# Patient Record
Sex: Male | Born: 1950
Health system: Southern US, Community
[De-identification: ages and names within clinical notes are randomized; demographics above are authoritative.]

## PROBLEM LIST (undated history)

## (undated) DIAGNOSIS — R0609 Other forms of dyspnea: Secondary | ICD-10-CM

## (undated) DIAGNOSIS — R030 Elevated blood-pressure reading, without diagnosis of hypertension: Secondary | ICD-10-CM

## (undated) DIAGNOSIS — J309 Allergic rhinitis, unspecified: Secondary | ICD-10-CM

## (undated) DIAGNOSIS — N400 Enlarged prostate without lower urinary tract symptoms: Secondary | ICD-10-CM

## (undated) DIAGNOSIS — H8110 Benign paroxysmal vertigo, unspecified ear: Secondary | ICD-10-CM

## (undated) DIAGNOSIS — R05 Cough: Secondary | ICD-10-CM

## (undated) DIAGNOSIS — E785 Hyperlipidemia, unspecified: Secondary | ICD-10-CM

## (undated) DIAGNOSIS — Z800 Family history of malignant neoplasm of digestive organs: Secondary | ICD-10-CM

## (undated) DIAGNOSIS — E669 Obesity, unspecified: Secondary | ICD-10-CM

## (undated) HISTORY — DX: Cough: R05

## (undated) HISTORY — DX: Hyperlipidemia, unspecified: E78.5

## (undated) HISTORY — DX: Benign paroxysmal vertigo, unspecified ear: H81.10

## (undated) HISTORY — DX: Other forms of dyspnea: R06.09

## (undated) HISTORY — DX: Obesity, unspecified: E66.9

## (undated) HISTORY — PX: TRIGGER FINGER RELEASE: SHX641

## (undated) HISTORY — DX: Allergic rhinitis, unspecified: J30.9

## (undated) HISTORY — DX: Benign prostatic hyperplasia without lower urinary tract symptoms: N40.0

## (undated) HISTORY — DX: Elevated blood-pressure reading, without diagnosis of hypertension: R03.0

## (undated) HISTORY — PX: CYSTOSCOPY: SUR368

## (undated) HISTORY — DX: Family history of malignant neoplasm of digestive organs: Z80.0

## (undated) HISTORY — PX: OTHER SURGICAL HISTORY: SHX169

---

## 2010-05-02 ENCOUNTER — Emergency Department (HOSPITAL_COMMUNITY)
Admission: EM | Admit: 2010-05-02 | Discharge: 2010-05-02 | Payer: Self-pay | Source: Home / Self Care | Admitting: Emergency Medicine

## 2010-05-06 ENCOUNTER — Emergency Department (HOSPITAL_COMMUNITY)
Admission: EM | Admit: 2010-05-06 | Discharge: 2010-05-06 | Payer: Self-pay | Source: Home / Self Care | Admitting: Emergency Medicine

## 2010-07-17 ENCOUNTER — Emergency Department (HOSPITAL_COMMUNITY)
Admission: EM | Admit: 2010-07-17 | Discharge: 2010-07-17 | Disposition: A | Discharge status: Home / Self Care | Payer: Self-pay | Attending: Emergency Medicine | Admitting: Emergency Medicine

## 2010-07-17 DIAGNOSIS — M545 Low back pain, unspecified: Secondary | ICD-10-CM | POA: Insufficient documentation

## 2010-07-17 DIAGNOSIS — R109 Unspecified abdominal pain: Secondary | ICD-10-CM | POA: Insufficient documentation

## 2010-07-17 LAB — URINALYSIS, ROUTINE W REFLEX MICROSCOPIC
Bilirubin Urine: NEGATIVE
Hgb urine dipstick: NEGATIVE
Ketones, ur: NEGATIVE mg/dL
Nitrite: NEGATIVE
Specific Gravity, Urine: 1.013 (ref 1.005–1.030)
Urobilinogen, UA: 0.2 mg/dL (ref 0.0–1.0)

## 2010-07-17 LAB — URINE MICROSCOPIC-ADD ON

## 2011-04-27 ENCOUNTER — Ambulatory Visit (INDEPENDENT_AMBULATORY_CARE_PROVIDER_SITE_OTHER): Payer: Self-pay | Admitting: Family Medicine

## 2011-04-27 ENCOUNTER — Encounter: Payer: Self-pay | Admitting: Family Medicine

## 2011-04-27 VITALS — BP 130/80 | HR 74 | Ht 66.0 in | Wt 193.2 lb

## 2011-04-27 DIAGNOSIS — N400 Enlarged prostate without lower urinary tract symptoms: Secondary | ICD-10-CM | POA: Insufficient documentation

## 2011-04-27 DIAGNOSIS — E669 Obesity, unspecified: Secondary | ICD-10-CM | POA: Insufficient documentation

## 2011-04-27 DIAGNOSIS — M25569 Pain in unspecified knee: Secondary | ICD-10-CM

## 2011-04-27 DIAGNOSIS — M25561 Pain in right knee: Secondary | ICD-10-CM

## 2011-04-27 DIAGNOSIS — M79609 Pain in unspecified limb: Secondary | ICD-10-CM

## 2011-04-27 DIAGNOSIS — M79646 Pain in unspecified finger(s): Secondary | ICD-10-CM

## 2011-04-27 DIAGNOSIS — B353 Tinea pedis: Secondary | ICD-10-CM | POA: Insufficient documentation

## 2011-04-27 DIAGNOSIS — M25562 Pain in left knee: Secondary | ICD-10-CM

## 2011-04-27 HISTORY — DX: Obesity, unspecified: E66.9

## 2011-04-27 NOTE — Assessment & Plan Note (Signed)
Very likely osteoarthritis

## 2011-04-27 NOTE — Patient Instructions (Signed)
I am assuming that your knee pain is from osteoarthritis.   You should take Acetaminophen sustained release 650 mg tablets to take one every 6 hours.   If you have a spell of worse pain, you can take 3 of the 200 mg Ibuprofen tablets three times daily with food.   When you get Daryl Levine coverage, call Dr Sheffield Slider to arrange the x-rays of your knees and for lab work.

## 2011-04-27 NOTE — Progress Notes (Signed)
  Subjective:    Patient ID: Daryl Levine, male    DOB: 1950-06-11, 60 y.o.   MRN: [id number]  HPI he is here for general the patient is primary problem is with knee pain greater on the left side. He had a procedure done by an orthopedist in 2003 but did not require him being put to sleep.  Recently he's had increasing pain described as burning. This morning the medial side of the knees. The pain occasionally awakens during the night but it's most bothersome when he first gets up or after prolonged sitting. It also worsens with walking stairs or for long distances. It does not lock, swell or give out.   He gets occasional pain in the left thumb at the site of where he had surgery for what sounds like a trigger finger.  He has had athlete's foot in the past but has not been bothersome recently  Review of Systems  Constitutional: Negative for activity change and unexpected weight change.  HENT: Negative for hearing loss.   Respiratory: Negative for cough and shortness of breath.   Cardiovascular: Negative for chest pain and leg swelling.  Gastrointestinal: Negative for abdominal pain and blood in stool.  Genitourinary: Positive for difficulty urinating. Negative for dysuria, urgency and frequency.  Musculoskeletal: Positive for arthralgias and gait problem. Negative for back pain and joint swelling.  Neurological: Negative for weakness.       Objective:   Physical Exam  Constitutional: He is oriented to person, place, and time. He appears well-developed and well-nourished.  HENT:  Head: Normocephalic.  Right Ear: External ear normal.  Left Ear: External ear normal.  Nose: Nose normal.  Mouth/Throat: Oropharynx is clear and moist.  Eyes: Conjunctivae are normal. Pupils are equal, round, and reactive to light. Right eye exhibits no discharge. Left eye exhibits no discharge. No scleral icterus.       Fundi normal  Neck: Normal range of motion. Neck supple. No thyromegaly present.    Cardiovascular: Normal rate, regular rhythm and normal heart sounds.   No murmur heard. Pulmonary/Chest: Effort normal and breath sounds normal.  Abdominal: Soft. Bowel sounds are normal. He exhibits no mass. There is no tenderness.  Genitourinary: Penis normal.       No hernias.  Musculoskeletal: Normal range of motion. He exhibits no edema and no tenderness.       Both knees tender medial joint lines and medial edges of patellae. No instability. Neg grind test.  No effusion. Squats without apparent discomfort. No apparent surgical scars on left. Nl range of motion of hips  Lymphadenopathy:    He has no cervical adenopathy.  Neurological: He is alert and oriented to person, place, and time. He has normal reflexes. No cranial nerve deficit.  Skin: Skin is warm. Rash noted.       Desquamation of soles of feet and lateral interdigital fissuring worse right foot.  Psychiatric: He has a normal mood and affect. His behavior is normal. Judgment and thought content normal.          Assessment & Plan:

## 2011-04-28 ENCOUNTER — Telehealth: Payer: Self-pay | Admitting: Family Medicine

## 2011-04-28 DIAGNOSIS — M25569 Pain in unspecified knee: Secondary | ICD-10-CM

## 2011-04-28 NOTE — Telephone Encounter (Signed)
He now has [employer] coverage so labs and xrays were ordered

## 2011-04-30 ENCOUNTER — Telehealth (INDEPENDENT_AMBULATORY_CARE_PROVIDER_SITE_OTHER): Payer: Self-pay | Admitting: Family Medicine

## 2011-04-30 DIAGNOSIS — Z1211 Encounter for screening for malignant neoplasm of colon: Secondary | ICD-10-CM

## 2011-04-30 NOTE — Telephone Encounter (Signed)
Called pt. (spanish speaking) left message to call us back. Please tell pt: order for labs ready. Pt can make appt for lab visit only. Also, if pt has a [facility] card, he can go to Radiology at [facility]. Order for x-ray done. Waiting for call back. Lorenda Hatchet, Renato Battles

## 2011-04-30 NOTE — Telephone Encounter (Signed)
The wife is calling about the orders the blood work and for an Personal assistant.

## 2011-05-03 ENCOUNTER — Ambulatory Visit (HOSPITAL_COMMUNITY)
Admission: RE | Admit: 2011-05-03 | Discharge: 2011-05-03 | Disposition: A | Discharge status: Home / Self Care | Payer: Self-pay | Source: Ambulatory Visit | Attending: Family Medicine | Admitting: Family Medicine

## 2011-05-03 DIAGNOSIS — M25569 Pain in unspecified knee: Secondary | ICD-10-CM | POA: Insufficient documentation

## 2011-05-04 ENCOUNTER — Other Ambulatory Visit: Payer: Self-pay

## 2011-05-04 DIAGNOSIS — M25569 Pain in unspecified knee: Secondary | ICD-10-CM

## 2011-05-04 LAB — CBC
MCH: 28.8 pg (ref 26.0–34.0)
MCV: 85 fL (ref 78.0–100.0)
Platelets: 314 10*3/uL (ref 150–400)
RDW: 15.2 % (ref 11.5–15.5)
WBC: 6.3 10*3/uL (ref 4.0–10.5)

## 2011-05-04 LAB — COMPREHENSIVE METABOLIC PANEL
ALT: 18 U/L (ref 0–53)
AST: 19 U/L (ref 0–37)
Creat: 0.93 mg/dL (ref 0.50–1.35)
Sodium: 137 mEq/L (ref 135–145)
Total Bilirubin: 0.6 mg/dL (ref 0.3–1.2)
Total Protein: 7 g/dL (ref 6.0–8.3)

## 2011-05-04 LAB — HEMOCCULT GUIAC POC 1CARD (OFFICE): Card #3 Fecal Occult Blood, POC: NEGATIVE

## 2011-05-04 LAB — LIPID PANEL
Cholesterol: 263 mg/dL — ABNORMAL HIGH (ref 0–200)
HDL: 59 mg/dL (ref >39)
Total CHOL/HDL Ratio: 4.5 Ratio
VLDL: 22 mg/dL (ref 0–40)

## 2011-05-04 NOTE — Telephone Encounter (Signed)
Addended by: [place], Diandre Merica on: 05/04/2011 11:56 AM   Modules accepted: Orders

## 2011-05-04 NOTE — Progress Notes (Signed)
Cmp,flp and cbc done today [facility]

## 2011-05-05 ENCOUNTER — Encounter: Payer: Self-pay | Admitting: Family Medicine

## 2011-05-06 ENCOUNTER — Telehealth: Payer: Self-pay | Admitting: Family Medicine

## 2011-05-06 NOTE — Telephone Encounter (Signed)
Daryl Levine was returning call for husband.  Please call back with msg.

## 2011-05-06 NOTE — Telephone Encounter (Signed)
Marines, would you please call this patient. See previous message. Lorenda Hatchet, Renato Battles

## 2011-05-10 ENCOUNTER — Encounter: Payer: Self-pay | Admitting: Family Medicine

## 2011-05-10 ENCOUNTER — Telehealth (HOSPITAL_COMMUNITY): Payer: Self-pay | Admitting: Family Medicine

## 2011-05-10 NOTE — Telephone Encounter (Signed)
I called Pt but nobody answer the phone I let a MSN. Pt maybe call us back.  Marines

## 2011-05-10 NOTE — Telephone Encounter (Signed)
Thank you. Please see previous messages, if pt calls back. Daryl Levine, Daryl Levine

## 2011-05-13 ENCOUNTER — Telehealth: Payer: Self-pay | Admitting: Family Medicine

## 2011-05-13 DIAGNOSIS — N400 Enlarged prostate without lower urinary tract symptoms: Secondary | ICD-10-CM

## 2011-05-13 MED ORDER — TAMSULOSIN HCL 0.4 MG PO CAPS: 0.4000 mg | ORAL_CAPSULE | Freq: Every day | ORAL | Status: DC

## 2011-05-13 NOTE — Telephone Encounter (Signed)
I reviewed Daryl Levine's  lab results with him he had received a letter which listed them. In addition I received his I PSS score which has near to severe symptoms with a score of 19.  He has started diet and exercise. We discussed options for treating the arthritis in his knees. He would like to start medication for BPH so I am sending in Tamsulosin.   He will schedule a follow up visit in 2 months.

## 2011-06-10 ENCOUNTER — Encounter: Payer: Self-pay | Admitting: Family Medicine

## 2011-06-10 ENCOUNTER — Ambulatory Visit (INDEPENDENT_AMBULATORY_CARE_PROVIDER_SITE_OTHER): Payer: Self-pay | Admitting: Family Medicine

## 2011-06-10 VITALS — BP 149/89 | HR 65 | Temp 98.3°F | Ht 66.0 in | Wt 193.4 lb

## 2011-06-10 DIAGNOSIS — M545 Low back pain, unspecified: Secondary | ICD-10-CM

## 2011-06-10 MED ORDER — LORAZEPAM 2 MG PO TABS: 2.0000 mg | ORAL_TABLET | Freq: Every evening | ORAL | Status: AC | PRN

## 2011-06-10 MED ORDER — CYCLOBENZAPRINE HCL 10 MG PO TABS: 10.0000 mg | ORAL_TABLET | Freq: Three times a day (TID) | ORAL | Status: AC | PRN

## 2011-06-10 MED ORDER — IBUPROFEN 200 MG PO TABS: 600.0000 mg | ORAL_TABLET | Freq: Four times a day (QID) | ORAL | Status: DC | PRN

## 2011-06-10 NOTE — Assessment & Plan Note (Signed)
New. 1 week duration. Appears to be muscle strain/spasm. Will treat with muscle relaxants, NSAIDs, warm compresses. Follow-up in 2 weeks if not improved. Patient and wife told may take a few weeks for pain to improve.

## 2011-06-10 NOTE — Progress Notes (Signed)
  Subjective:    Patient ID: Daryl Levine, male    DOB: June 09, 1950, 61 y.o.   MRN: [id number]  HPI Work-in for left lumbar back pain: Duration 1 week. Started happening after episode of left knee pain (he has history of arthritis). As knee pain started getting better, he noticed pain in his left back.  Pain has been getting worse. It is constant but becomes significantly worse with movement and position changes.  Alleviated by: staying still Exacerbated by: movement. No difference between sitting, standing, lying.  Medications: tried Tylenol (bid), ibuprofen (daily), or Vicodin (x1). None have really helped his pain significantly.  Associated issues: difficulty working due to the pain. Stands most of the time at work.  Review of Systems Per HPI with inclusion of following: Denies fevers Some decreased sensation down left side of leg with tingling but no numbness No weakness or recent falls No urinary or bowel incontinence Denies dysuria/frequency/urgency/hematuria    Objective:   Physical Exam Gen: appears uncomfortable, sitting stiffly up on exam table; accompanied by wife Back: muscle tension left lumbar area   ROM: flexion: 20 deg, extension: 0, lateral flexion: 20 on right, 10 on left   Strength: 5/5 lower extremities bilaterally leg and foot   Sensation: intact   Patellar reflexes: 2+ bilaterally    Assessment & Plan:

## 2011-06-10 NOTE — Patient Instructions (Signed)
I think you may have pulled a muscle in your left back. Try these medications for now: -ibuprofen 500-600 mg every 8 hours for the next 2 days. Then after 2 days, take every 8 hours as needed. -flexeril 1 tablet every 8 hours for the next 2 days then every 8 hours as needed. -Ativan 2 mg before bedtime. Try this tonight to see if it helps you sleep and helps the pain.  -Warm compresses on your back to help relax the muscle -Gentle back stretches 2 times a day.  Follow-up in 2 weeks with Dr. Sheffield Slider if your back pain is not improved or if you start losing control of your bladder or bowel or your legs start becoming weak (they drop out from under you).

## 2011-06-11 ENCOUNTER — Ambulatory Visit: Payer: Self-pay | Admitting: Family Medicine

## 2011-12-30 ENCOUNTER — Encounter: Payer: Commercial Managed Care - PPO | Admitting: Family Medicine

## 2012-04-24 ENCOUNTER — Ambulatory Visit (INDEPENDENT_AMBULATORY_CARE_PROVIDER_SITE_OTHER): Payer: Commercial Managed Care - PPO | Admitting: Family Medicine

## 2012-04-24 ENCOUNTER — Telehealth: Payer: Self-pay | Admitting: *Deleted

## 2012-04-24 VITALS — BP 144/91 | HR 75 | Temp 98.5°F | Ht 66.0 in | Wt 204.0 lb

## 2012-04-24 DIAGNOSIS — IMO0001 Reserved for inherently not codable concepts without codable children: Secondary | ICD-10-CM

## 2012-04-24 DIAGNOSIS — Z800 Family history of malignant neoplasm of digestive organs: Secondary | ICD-10-CM

## 2012-04-24 DIAGNOSIS — R053 Chronic cough: Secondary | ICD-10-CM

## 2012-04-24 DIAGNOSIS — R030 Elevated blood-pressure reading, without diagnosis of hypertension: Secondary | ICD-10-CM

## 2012-04-24 DIAGNOSIS — B353 Tinea pedis: Secondary | ICD-10-CM

## 2012-04-24 DIAGNOSIS — R05 Cough: Secondary | ICD-10-CM

## 2012-04-24 DIAGNOSIS — R059 Cough, unspecified: Secondary | ICD-10-CM

## 2012-04-24 DIAGNOSIS — E669 Obesity, unspecified: Secondary | ICD-10-CM

## 2012-04-24 HISTORY — DX: Chronic cough: R05.3

## 2012-04-24 HISTORY — DX: Reserved for inherently not codable concepts without codable children: IMO0001

## 2012-04-24 HISTORY — DX: Family history of malignant neoplasm of digestive organs: Z80.0

## 2012-04-24 NOTE — Assessment & Plan Note (Signed)
No indications of significant pathology

## 2012-04-24 NOTE — Patient Instructions (Addendum)
Please return to see Dr Sheffield Slider in 1 month for blood pressure check. Bring your blood pressure readings with you. Get a large cuff.   Return fasting in a couple weeks for cholesterol test, glucose and urine test.   Try to limit the salt in your diet.

## 2012-04-24 NOTE — Progress Notes (Signed)
  Subjective:    Patient ID: Daryl Levine, male    DOB: [birth year], 61 y.o.   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   Had a cough for a month that is from a dry tickle in his throat. Some post nasal drip, but no other allergy symptoms or wheezing. Worst during the daytime. No GERD symptoms. No shortness of breath or ankle swelling.   BPH - urination slow, stops and starts, dribbles, mild urgency, nocturia x 1. No dysuria. Never got the Tamsulosin filled    Review of Systems  Constitutional: Positive for diaphoresis. Negative for fever, chills, activity change, appetite change, fatigue and unexpected weight change.  HENT: Positive for postnasal drip. Negative for hearing loss and sneezing.   Eyes: Positive for redness. Negative for pain.  Respiratory: Positive for cough and shortness of breath. Negative for wheezing.   Cardiovascular: Negative for chest pain.  Gastrointestinal: Negative for nausea and vomiting.       Objective:   Physical Exam  Constitutional: He is oriented to person, place, and time. He appears well-developed and well-nourished.       Generalized obesity but muscular   HENT:  Head: Normocephalic.  Right Ear: External ear normal.  Left Ear: External ear normal.  Nose: Nose normal.  Mouth/Throat: Oropharynx is clear and moist. No oropharyngeal exudate.  Eyes: Conjunctivae normal and EOM are normal. Pupils are equal, round, and reactive to light. Right eye exhibits no discharge. Left eye exhibits no discharge. No scleral icterus.  Neck: Neck supple. No JVD present. No tracheal deviation present. No thyromegaly present.  Cardiovascular: Normal rate and regular rhythm.   No murmur heard. Pulmonary/Chest: Effort normal and breath sounds normal. No respiratory distress. He has no wheezes. He has no rales.  Abdominal: He  exhibits no distension and no mass. There is no tenderness.  Genitourinary: Penis normal.       No hernias  Musculoskeletal: Normal range of motion. He exhibits no edema.  Lymphadenopathy:    He has no cervical adenopathy.  Neurological: He is alert and oriented to person, place, and time. He displays normal reflexes. No cranial nerve deficit. He exhibits normal muscle tone. Coordination normal.  Skin: Skin is warm and dry.       Tinea pedis changes between lateral toes  Psychiatric: He has a normal mood and affect. His behavior is normal. Judgment and thought content normal.          Assessment & Plan:

## 2012-04-24 NOTE — Assessment & Plan Note (Signed)
Recommended he restart antifungal cream

## 2012-04-24 NOTE — Assessment & Plan Note (Signed)
He will switch to water from sugar sweetened drinks and try to decrease carbs.

## 2012-04-24 NOTE — Telephone Encounter (Signed)
Left message to call us. Pt has appt today at 4:25 pm. Please ask, if he can come in earlier. Thanks, .Arlyss Repress

## 2012-04-24 NOTE — Assessment & Plan Note (Signed)
Mildly elevated so he will try weight loss, walking, avoiding salt and recheck in one month.

## 2012-09-28 ENCOUNTER — Ambulatory Visit (INDEPENDENT_AMBULATORY_CARE_PROVIDER_SITE_OTHER): Payer: Commercial Managed Care - PPO | Admitting: Family Medicine

## 2012-09-28 ENCOUNTER — Encounter: Payer: Self-pay | Admitting: Family Medicine

## 2012-09-28 VITALS — BP 135/87 | HR 71 | Temp 98.8°F | Ht 66.0 in | Wt 204.0 lb

## 2012-09-28 DIAGNOSIS — M722 Plantar fascial fibromatosis: Secondary | ICD-10-CM

## 2012-09-28 DIAGNOSIS — E785 Hyperlipidemia, unspecified: Secondary | ICD-10-CM

## 2012-09-28 DIAGNOSIS — R509 Fever, unspecified: Secondary | ICD-10-CM | POA: Insufficient documentation

## 2012-09-28 DIAGNOSIS — R030 Elevated blood-pressure reading, without diagnosis of hypertension: Secondary | ICD-10-CM

## 2012-09-28 DIAGNOSIS — IMO0001 Reserved for inherently not codable concepts without codable children: Secondary | ICD-10-CM

## 2012-09-28 DIAGNOSIS — J029 Acute pharyngitis, unspecified: Secondary | ICD-10-CM

## 2012-09-28 LAB — POCT URINALYSIS DIPSTICK
Ketones, UA: NEGATIVE
Leukocytes, UA: NEGATIVE
Nitrite, UA: NEGATIVE
Urobilinogen, UA: 0.2
pH, UA: 5.5

## 2012-09-28 LAB — CBC WITH DIFFERENTIAL/PLATELET
Basophils Absolute: 0 10*3/uL (ref 0.0–0.1)
Basophils Relative: 1 % (ref 0–1)
HCT: 43.1 % (ref 39.0–52.0)
MCHC: 34.1 g/dL (ref 30.0–36.0)
Monocytes Absolute: 1 10*3/uL (ref 0.1–1.0)
Neutro Abs: 1.8 10*3/uL (ref 1.7–7.7)
Platelets: 233 10*3/uL (ref 150–400)
RDW: 14.4 % (ref 11.5–15.5)

## 2012-09-28 LAB — POCT RAPID STREP A (OFFICE): Rapid Strep A Screen: NEGATIVE

## 2012-09-28 LAB — POCT UA - MICROSCOPIC ONLY

## 2012-09-28 NOTE — Patient Instructions (Addendum)
Your fever is most likely due to a viral illness, although tick bourne illness is a concern this time of year. I am doing a blood count and will contact you with the result and we'll decide if antibiotic treatment is likely to be helpful.    Plantar Fasciitis (Heel Spur Syndrome) with Rehab The plantar fascia is a fibrous, ligament-like, soft-tissue structure that spans the bottom of the foot. Plantar fasciitis is a condition that causes pain in the foot due to inflammation of the tissue. SYMPTOMS   Pain and tenderness on the underneath side of the foot.  Pain that worsens with standing or walking. CAUSES  Plantar fasciitis is caused by irritation and injury to the plantar fascia on the underneath side of the foot. Common mechanisms of injury include:  Direct trauma to bottom of the foot.  Damage to a small nerve that runs under the foot where the main fascia attaches to the heel bone.  Stress placed on the plantar fascia due to bone spurs. RISK INCREASES WITH:   Activities that place stress on the plantar fascia (running, jumping, pivoting, or cutting).  Poor strength and flexibility.  Improperly fitted shoes.  Tight calf muscles.  Flat feet.  Failure to warm-up properly before activity.  Obesity. PREVENTION  Warm up and stretch properly before activity.  Allow for adequate recovery between workouts.  Maintain physical fitness:  Strength, flexibility, and endurance.  Cardiovascular fitness.  Maintain a health body weight.  Avoid stress on the plantar fascia.  Wear properly fitted shoes, including arch supports for individuals who have flat feet. PROGNOSIS  If treated properly, then the symptoms of plantar fasciitis usually resolve without surgery. However, occasionally surgery is necessary. RELATED COMPLICATIONS   Recurrent symptoms that may result in a chronic condition.  Problems of the lower back that are caused by compensating for the injury, such as  limping.  Pain or weakness of the foot during push-off following surgery.  Chronic inflammation, scarring, and partial or complete fascia tear, occurring more often from repeated injections. TREATMENT  Treatment initially involves the use of ice and medication to help reduce pain and inflammation. The use of strengthening and stretching exercises may help reduce pain with activity, especially stretches of the Achilles tendon. These exercises may be performed at home or with a therapist. Your caregiver may recommend that you use heel cups of arch supports to help reduce stress on the plantar fascia. Occasionally, corticosteroid injections are given to reduce inflammation. If symptoms persist for greater than 6 months despite non-surgical (conservative), then surgery may be recommended.  MEDICATION   If pain medication is necessary, then nonsteroidal anti-inflammatory medications, such as aspirin and ibuprofen, or other minor pain relievers, such as acetaminophen, are often recommended.  Do not take pain medication within 7 days before surgery.  Prescription pain relievers may be given if deemed necessary by your caregiver. Use only as directed and only as much as you need.  Corticosteroid injections may be given by your caregiver. These injections should be reserved for the most serious cases, because they may only be given a certain number of times. HEAT AND COLD  Cold treatment (icing) relieves pain and reduces inflammation. Cold treatment should be applied for 10 to 15 minutes every 2 to 3 hours for inflammation and pain and immediately after any activity that aggravates your symptoms. Use ice packs or massage the area with a piece of ice (ice massage).  Heat treatment may be used prior to performing the stretching  and strengthening activities prescribed by your caregiver, physical therapist, or athletic trainer. Use a heat pack or soak the injury in warm water. SEEK IMMEDIATE MEDICAL CARE  IF:  Treatment seems to offer no benefit, or the condition worsens.  Any medications produce adverse side effects. EXERCISES RANGE OF MOTION (ROM) AND STRETCHING EXERCISES - Plantar Fasciitis (Heel Spur Syndrome) These exercises may help you when beginning to rehabilitate your injury. Your symptoms may resolve with or without further involvement from your physician, physical therapist or athletic trainer. While completing these exercises, remember:   Restoring tissue flexibility helps normal motion to return to the joints. This allows healthier, less painful movement and activity.  An effective stretch should be held for at least 30 seconds.  A stretch should never be painful. You should only feel a gentle lengthening or release in the stretched tissue. RANGE OF MOTION - Toe Extension, Flexion  Sit with your right / left leg crossed over your opposite knee.  Grasp your toes and gently pull them back toward the top of your foot. You should feel a stretch on the bottom of your toes and/or foot.  Hold this stretch for __________ seconds.  Now, gently pull your toes toward the bottom of your foot. You should feel a stretch on the top of your toes and or foot.  Hold this stretch for __________ seconds. Repeat __________ times. Complete this stretch __________ times per day.  RANGE OF MOTION - Ankle Dorsiflexion, Active Assisted  Remove shoes and sit on a chair that is preferably not on a carpeted surface.  Place right / left foot under knee. Extend your opposite leg for support.  Keeping your heel down, slide your right / left foot back toward the chair until you feel a stretch at your ankle or calf. If you do not feel a stretch, slide your bottom forward to the edge of the chair, while still keeping your heel down.  Hold this stretch for __________ seconds. Repeat __________ times. Complete this stretch __________ times per day.  STRETCH  Gastroc, Standing  Place hands on  wall.  Extend right / left leg, keeping the front knee somewhat bent.  Slightly point your toes inward on your back foot.  Keeping your right / left heel on the floor and your knee straight, shift your weight toward the wall, not allowing your back to arch.  You should feel a gentle stretch in the right / left calf. Hold this position for __________ seconds. Repeat __________ times. Complete this stretch __________ times per day. STRETCH  Soleus, Standing  Place hands on wall.  Extend right / left leg, keeping the other knee somewhat bent.  Slightly point your toes inward on your back foot.  Keep your right / left heel on the floor, bend your back knee, and slightly shift your weight over the back leg so that you feel a gentle stretch deep in your back calf.  Hold this position for __________ seconds. Repeat __________ times. Complete this stretch __________ times per day. STRETCH  Gastrocsoleus, Standing  Note: This exercise can place a lot of stress on your foot and ankle. Please complete this exercise only if specifically instructed by your caregiver.   Place the ball of your right / left foot on a step, keeping your other foot firmly on the same step.  Hold on to the wall or a rail for balance.  Slowly lift your other foot, allowing your body weight to press your heel down over  the edge of the step.  You should feel a stretch in your right / left calf.  Hold this position for __________ seconds.  Repeat this exercise with a slight bend in your right / left knee. Repeat __________ times. Complete this stretch __________ times per day.  STRENGTHENING EXERCISES - Plantar Fasciitis (Heel Spur Syndrome)  These exercises may help you when beginning to rehabilitate your injury. They may resolve your symptoms with or without further involvement from your physician, physical therapist or athletic trainer. While completing these exercises, remember:   Muscles can gain both the  endurance and the strength needed for everyday activities through controlled exercises.  Complete these exercises as instructed by your physician, physical therapist or athletic trainer. Progress the resistance and repetitions only as guided. STRENGTH - Towel Curls  Sit in a chair positioned on a non-carpeted surface.  Place your foot on a towel, keeping your heel on the floor.  Pull the towel toward your heel by only curling your toes. Keep your heel on the floor.  If instructed by your physician, physical therapist or athletic trainer, add ____________________ at the end of the towel. Repeat __________ times. Complete this exercise __________ times per day. STRENGTH - Ankle Inversion  Secure one end of a rubber exercise band/tubing to a fixed object (table, pole). Loop the other end around your foot just before your toes.  Place your fists between your knees. This will focus your strengthening at your ankle.  Slowly, pull your big toe up and in, making sure the band/tubing is positioned to resist the entire motion.  Hold this position for __________ seconds.  Have your muscles resist the band/tubing as it slowly pulls your foot back to the starting position. Repeat __________ times. Complete this exercises __________ times per day.  Document Released: 05/17/2005 Document Revised: 08/09/2011 Document Reviewed: 08/29/2008 [facility] Patient Information 2013 [place], [place].

## 2012-09-28 NOTE — Assessment & Plan Note (Signed)
Since his quick strep is negative, it's unlikely that he caught strep from his wife, although he did take one of her Vantin's. There's no pyuria, so doubt UTI. Could be they both have a late season influenza. I will call him with his CBC results, to decide whether to do a chest xray or start Doxycycline. I discussed with him and his wife how taking random antibiotic left-overs can confuse the diagnostic picture. He looks minimally ill, so he will start Ibuprofen for his plantar fasciitis which will also help his fever and myalgias.

## 2012-09-28 NOTE — Assessment & Plan Note (Signed)
Classic history. Given exercise patient info.

## 2012-09-28 NOTE — Progress Notes (Signed)
  Subjective:    Patient ID: Daryl Levine, male    DOB: 12-29-1950, 62 y.o.   MRN: [id number]  HPI Fever - felt tired yesterday morning and by the evening had shaking chills and generalized myalgias, with headache when he coughs. Felt a little dyspneic on exertion yesterday. No known tick exposure. No rash. Nauseated, but no vomiting or diarrhea. His wife gave him one of her antibiotic pills prescribed yesterday.   Cough - Chronic tickle occasionally productive. Hasn't been wheezing   Family illness - Wife, [name], treated yesterday by Dr Gwendolyn Grant for sore throat x 5 days, which persists today. Quick strep was neg, but she had taken amoxicillin and cipro that she had on hand. She doesn't have fever.   right heel pain - Past month his ant-medial heel hurts, especially on first arising from bed or sleeping. No trauma or unusual exercise.   Dysuria - burning one time last week with urinary frequency x 1 month, but no urgency or nocturia.   Preventive - he'd like to get his colonoscopy done since his father died of colon CA   Review of Systems     Objective:   Physical Exam  Constitutional: He appears well-developed and well-nourished.  HENT:  Head: Normocephalic.  Right Ear: External ear normal.  Left Ear: External ear normal.  Nose: Nose normal.  Mouth/Throat: Oropharynx is clear and moist. No oropharyngeal exudate.  Eyes: Conjunctivae and EOM are normal. Pupils are equal, round, and reactive to light. Right eye exhibits no discharge. Left eye exhibits no discharge.  Neck: Normal range of motion. Neck supple.  Cardiovascular: Normal rate and regular rhythm.   No murmur heard. Pulmonary/Chest: Effort normal and breath sounds normal. He has no wheezes. He has no rales.  Abdominal: Soft. There is no tenderness. There is no rebound and no guarding.  No CVA tenderness  Musculoskeletal:  Tender over right anterior medical calcaneous.  No erythema or swelling  Lymphadenopathy:    He has no  cervical adenopathy.  Neurological: He is alert.          Assessment & Plan:

## 2012-09-29 ENCOUNTER — Telehealth: Payer: Self-pay | Admitting: Family Medicine

## 2012-09-29 ENCOUNTER — Encounter: Payer: Self-pay | Admitting: Family Medicine

## 2012-09-29 NOTE — Telephone Encounter (Signed)
I spoke with Daryl Levine to report that his CBC was normal. She said that he went to work this AM and did not have fever last evening.   She reports that she still has a severe sore throat, despite continuing the Vantin that Dr Gwendolyn Grant prescribed. She hasn't developed a cough. She isn't taking anything for pain so I recommended 600 mg of Ibuprofen with meals.   They should call if they fail to improve.

## 2013-03-12 ENCOUNTER — Ambulatory Visit (INDEPENDENT_AMBULATORY_CARE_PROVIDER_SITE_OTHER): Payer: Commercial Managed Care - PPO | Admitting: Family Medicine

## 2013-03-12 ENCOUNTER — Encounter: Payer: Self-pay | Admitting: Family Medicine

## 2013-03-12 VITALS — BP 143/85 | HR 81 | Temp 98.3°F | Ht 66.0 in | Wt 207.1 lb

## 2013-03-12 DIAGNOSIS — IMO0002 Reserved for concepts with insufficient information to code with codable children: Secondary | ICD-10-CM | POA: Insufficient documentation

## 2013-03-12 DIAGNOSIS — L03012 Cellulitis of left finger: Secondary | ICD-10-CM

## 2013-03-12 DIAGNOSIS — R609 Edema, unspecified: Secondary | ICD-10-CM

## 2013-03-12 DIAGNOSIS — R600 Localized edema: Secondary | ICD-10-CM | POA: Insufficient documentation

## 2013-03-12 MED ORDER — CEPHALEXIN 250 MG PO CAPS: 250.0000 mg | ORAL_CAPSULE | Freq: Four times a day (QID) | ORAL | Status: DC

## 2013-03-12 NOTE — Patient Instructions (Signed)
Paronychia - will start on Keflex 250 mg four times, soak foot 3-4 times daily in epsom salt, make appointment in one week for have toenail removed.   Lower extremity swelling - check lab work, suspect that the swelling is due to you being on your feet throughout the day.   Dr. Randolm Idol will cal you with you lab results.

## 2013-03-12 NOTE — Assessment & Plan Note (Signed)
Paronychia of the left great toe over the medial aspect. No abscess present. -Patient will be starting the Keflex 250 mg 4 times daily for the next week, he is to perform daily Epsom salts soaks to promote drainage -He is to return in one week for toenail normal.

## 2013-03-12 NOTE — Assessment & Plan Note (Signed)
Patient has bilateral lower extremity edema. At this time is most likely secondary to venous insufficiency however we'll complete workup for hepatic/renal cause. No evidence of heart failure based on history and physical. -Check CBC/TSH/CMP

## 2013-03-12 NOTE — Progress Notes (Signed)
  Subjective:    Patient ID: Daryl Levine, male    DOB: [birth year], 62 y.o.   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, he thinks that he may have an ingrown toenail that may be contributing to his symptoms, he does have redness to the toe, he also has increased swelling in the dorsum of his left foot extending from the left toe, patient denies a history of paronychia, has never had this toenail removed, no fevers or chills  Patient also reports a history of bilateral lower extremity edema and has been intermittent for unknown period of time, worse at the end of the day, patient operates heavy equipment throughout the day and is mostly sitting however he does stand occasionally, he denies a history of cardiac/hepatic/renal disease. No evidence of orthopnea, no PND, no chest pain, no shortness of breath. He does not wear compression stockings during the day.  Patient is a nonsmoker.   Review of Systems  Constitutional: Negative for fever, chills and fatigue.  Respiratory: Negative for cough, choking and shortness of breath.   Cardiovascular: Negative for chest pain and palpitations.  Gastrointestinal: Negative for nausea, vomiting, diarrhea and abdominal distention.       Objective:   Physical Exam Vitals: Reviewed  General: Is a Hispanic male, no acute distress, sitting on examination table HEENT: Pupils are equal round and reactive to light, extraocular movements are intact, moist mucous membranes, neck was supple without adenopathy Cardiac: Regular in rhythm, S1 and S2 present, no murmurs, no JVD, patient able to lay flat on examination table Respiratory: Clear to auscultation bilaterally, normal effort Extremities: One plus edema in bilateral tibia, patient also has one to 2+ edema over the dorsum of the left foot Skin:  Erythema and warmth of the medial aspect of the left great toe consistent with paronychia, no active drainage, the toenail has been clipped at an angle in the medial aspect however does appear that there could toenail remaining causing the paronychia       Assessment & Plan:  Please see problem specific assessment and plan.

## 2013-03-13 ENCOUNTER — Encounter: Payer: Self-pay | Admitting: Family Medicine

## 2013-03-13 LAB — CBC WITH DIFFERENTIAL/PLATELET
Eosinophils Relative: 3 % (ref 0–5)
HCT: 42.7 % (ref 39.0–52.0)
Lymphocytes Relative: 36 % (ref 12–46)
Lymphs Abs: 2.7 10*3/uL (ref 0.7–4.0)
MCV: 84.4 fL (ref 78.0–100.0)
Monocytes Absolute: 0.8 10*3/uL (ref 0.1–1.0)
RBC: 5.06 MIL/uL (ref 4.22–5.81)
RDW: 14.1 % (ref 11.5–15.5)
WBC: 7.4 10*3/uL (ref 4.0–10.5)

## 2013-03-13 LAB — COMPLETE METABOLIC PANEL WITH GFR
ALT: 22 U/L (ref 0–53)
AST: 20 U/L (ref 0–37)
Albumin: 4.1 g/dL (ref 3.5–5.2)
Alkaline Phosphatase: 73 U/L (ref 39–117)
BUN: 20 mg/dL (ref 6–23)
CO2: 26 mEq/L (ref 19–32)
Calcium: 9 mg/dL (ref 8.4–10.5)
Chloride: 105 mEq/L (ref 96–112)
Creat: 0.88 mg/dL (ref 0.50–1.35)
GFR, Est African American: >89 mL/min
GFR, Est Non African American: >89 mL/min
Glucose, Bld: 81 mg/dL (ref 70–99)
Potassium: 4.1 mEq/L (ref 3.5–5.3)
Sodium: 137 mEq/L (ref 135–145)
Total Bilirubin: 0.7 mg/dL (ref 0.3–1.2)
Total Protein: 7 g/dL (ref 6.0–8.3)

## 2013-03-19 ENCOUNTER — Ambulatory Visit (INDEPENDENT_AMBULATORY_CARE_PROVIDER_SITE_OTHER): Payer: Commercial Managed Care - PPO | Admitting: Family Medicine

## 2013-03-19 ENCOUNTER — Encounter: Payer: Self-pay | Admitting: Family Medicine

## 2013-03-19 VITALS — BP 146/83 | HR 75 | Temp 98.1°F | Ht 66.0 in | Wt 207.7 lb

## 2013-03-19 DIAGNOSIS — L03012 Cellulitis of left finger: Secondary | ICD-10-CM

## 2013-03-19 DIAGNOSIS — IMO0002 Reserved for concepts with insufficient information to code with codable children: Secondary | ICD-10-CM

## 2013-03-19 DIAGNOSIS — R05 Cough: Secondary | ICD-10-CM

## 2013-03-19 DIAGNOSIS — R059 Cough, unspecified: Secondary | ICD-10-CM

## 2013-03-19 NOTE — Patient Instructions (Signed)
Infeccin en la ua del pie encarnada (Infected Ingrown Toenail) La ua encarnada se produce cuando el borde de la ua crece dentro de la piel y las bacterias invaden el rea. Los sntomas son dolor, sensibilidad, hinchazn y drenaje de pus en el borde de la ua. Zapatos que no ajustan bien, lesiones menores y IT consultant las uas de modo incorrecto tambin contribuyen al problema. Debe cortar las uas de forma cuadrada en vez de redondear los bordes. No las corte demasiado. Evite los zapatos ajustados o con punta. En algunos casos la zona encarnada de la ua debe extirparse. Si le retiran la ua, puede demorar entre 3 y 4 meses hasta que vuelva a Designer, industrial/product. INSTRUCCIONES PARA EL CUIDADO DOMICILIARIO  Remoje el dedo infectado en agua tibia durante 20 a 30 minutos, 3 a 4 veces por da.  Las compresas o vendajes deben cambiarse diariamente.  Tome todos los medicamentos segn las indicaciones y finalcelos.  Disminuya las actividades y [place] el pie elevado siempre que pueda, para reducir la hinchazn y las [place]. Haga esto hasta que la infeccin mejore.  Use sandalias o camine descalzo siempre que pueda mientras la zona infectada est sensible.  Concurra a una visita de seguimiento con el profesional luego de 2  3 das, si la infeccin no mejora. SOLICITE ATENCIN MDICA SI: El dedo se vuelve rojo, se hincha o duele. EST SEGURO QUE:   Comprende las instrucciones para el alta mdica.  Controlar su enfermedad.  Solicitar atencin mdica de inmediato segn las indicaciones. Document Released: 03/03/2006 Document Revised: 08/09/2011 [facility] Patient Information 2014 [place], [place]. Extirpacin de [place] ua del pie (Toenail Removal) Las uas pueden extirparse debido a traumatismos, infecciones o para English as a second language teacher crecimiento anormal. Se colocar una venda no adhesiva bien ajustada para Engineer, manufacturing. Generalmente una nueva ua crecer. La nueva ua podr crecer deformada. Muchas veces,  cuando una ua se pierde, se curar gradualmente pero puede quedar sensible durante un largo tiempo. INSTRUCCIONES PARA EL CUIDADO DOMICILIARIO  Mantenga el pie elevado para aliviar el dolor y la hinchazn. Para esto deber recostarse en una cama o sof, con la pierna colocada sobre [place], o deber sentarse con la pierna [place] arriba. Caminar o dejar que la pierna cuelgue hacia abajo, aumentar la hinchazn, se curar ms lentamente y sentir un dolor punzante.  Mantenga el vendaje, limpio y seco.  Cambie el vendaje cada 24 horas.  Luego de quitar el vendaje, remoje el pie en agua tibia jabonosa por 10  20 minutos. Realice esto 3 veces por da. Esto ayuda a reducir Chief Technology Officer y la hinchazn. Luego de sumergir [employer], coloque una venda seca y limpia. Cambie el vendaje si est hmedo o sucio.  Slo tome medicamentos de Sales promotion account executive o prescriptos para Primary school teacher, las [place], o bajar la fiebre segn las indicaciones de su mdico.  Consulte al profesional que lo asiste si ocurre algn problema. Debera aplicarse la vacuna contra el tetanos si:  No sabe cuando recibi la ltima dosis.  Nunca recibi esta vacuna.  La zona de la herida est sucia. Si usted necesita aplicarse la vacuna y se niega a recibirla, corre riesgo de contraer ttanos. sta es una enfermedad grave. Si le han aplicado la vacuna contra el ttanos, el brazo podr hincharse, Higher education careers adviser, sentirse caliente al tacto o podr aumentar la temperatura en el lugar de la inyeccin. Esto es frecuente y no representa un problema. SOLICITE ATENCIN MDICA INMEDIATAMENTE SI:  Presenta dolor intenso, hinchazn, enrojecimiento, calor, drena  lquido o sangra.  Tiene fiebre.  La hinchazn se extiende desde el dedo hacia el pie. Document Released: 09/02/2008 Document Revised: 08/09/2011 [facility] Patient Information 2014 [place], [place].  Call office if you have signs of infection such as increased redness and warmth to the area or  fevers/chills.

## 2013-03-20 NOTE — Progress Notes (Addendum)
  Subjective:    Patient ID: Daryl Levine, male    DOB: January 22, 1951, 62 y.o.   MRN: [id number]  HPI 62 y/o male presents for follow up of paronychia of left 1st great toe, medial aspect, improved s/p course of Keflex and warm soaks at home, decreased redness and pain today, presents for partial toenail removal.   Patient continues to have interrmittant cough, no PND, no orthopnea, states that he occasionally has mucus that he coughs up, no fevers or chills, once he is able to cough out the mucus his symptoms resolve, no sob, symptoms mostly occur at night, no associated abdominal pain, no heartburn, has not attempted any over the counter mucus/cough/cold medications.    Review of Systems  Constitutional: Negative for fever, chills and fatigue.  Respiratory: Positive for cough.   Cardiovascular: Negative for chest pain and leg swelling.  Gastrointestinal: Negative for diarrhea, constipation and abdominal distention.  Skin: Positive for wound.       Objective:   Physical Exam Vitals: reviewed Gen: pleasant Hispanic male, NAD Cardiac: RRR, S1 and S2 present, no murmurs, no heaves/thrills, no JVD Resp: CTAB, no wheezes, normal effort ABD: soft, nontender, no scars, normal bowel sounds Ext: trace edema, 2+ DP and radial pulses  Reviewed recent CBC/CMP/TSH which were unremarkable.   Procedure Note: The risks and benefits of the procedure were discussed with the patient. Written consent was obtained. The toenail was cleansed with and alcohol swab. Approximately 8 cc of 1% lidocaine w/o epinephrine was use to perform a digital block of the left 1st toe. The area was cleaned and prepped in a sterile fashion. A rubber band was placed around the base of the digit. A nail elevator was used to elevate the medial aspect of the nail plate. A nail splitter was used to cut the medial aspect of the nail up to the base. Hemostats were used to remove the medial aspect of the nail plate. The rubber band was  removed. Hemostasis was achieved. No complications were encountered. The patient tolerated the procedure well. The affected area was dressed with gauze and tape.        Assessment & Plan:  Please see problem specific assessment and plan.

## 2013-03-20 NOTE — Assessment & Plan Note (Signed)
Paronychia is improved with antibiotics and warm soaks. Partial toenail removal performed of the medial left 1st toe.

## 2013-03-20 NOTE — Assessment & Plan Note (Signed)
The etiology of the patients cough is unclear at this time. No physical signs of significant lung or heart pathology. Recent lab work including TSH/CBC/CMP were unremarkable. -will attempt trial of otc mucolytics -patient to return to office if symptoms persist

## 2013-05-22 ENCOUNTER — Ambulatory Visit (HOSPITAL_COMMUNITY)
Admission: RE | Admit: 2013-05-22 | Discharge: 2013-05-22 | Disposition: A | Discharge status: Home / Self Care | Payer: Commercial Managed Care - PPO | Source: Ambulatory Visit | Attending: Family Medicine | Admitting: Family Medicine

## 2013-05-22 ENCOUNTER — Ambulatory Visit (INDEPENDENT_AMBULATORY_CARE_PROVIDER_SITE_OTHER): Payer: Commercial Managed Care - PPO | Admitting: Family Medicine

## 2013-05-22 ENCOUNTER — Encounter: Payer: Self-pay | Admitting: Family Medicine

## 2013-05-22 VITALS — BP 154/93 | HR 78 | Temp 98.6°F | Ht 66.0 in | Wt 213.0 lb

## 2013-05-22 DIAGNOSIS — R059 Cough, unspecified: Secondary | ICD-10-CM

## 2013-05-22 DIAGNOSIS — R05 Cough: Secondary | ICD-10-CM | POA: Insufficient documentation

## 2013-05-22 DIAGNOSIS — M47814 Spondylosis without myelopathy or radiculopathy, thoracic region: Secondary | ICD-10-CM | POA: Insufficient documentation

## 2013-05-22 DIAGNOSIS — R0789 Other chest pain: Secondary | ICD-10-CM | POA: Insufficient documentation

## 2013-05-22 DIAGNOSIS — R030 Elevated blood-pressure reading, without diagnosis of hypertension: Secondary | ICD-10-CM

## 2013-05-22 DIAGNOSIS — IMO0001 Reserved for inherently not codable concepts without codable children: Secondary | ICD-10-CM

## 2013-05-22 MED ORDER — ALBUTEROL SULFATE HFA 108 (90 BASE) MCG/ACT IN AERS: 2.0000 | INHALATION_SPRAY | Freq: Four times a day (QID) | RESPIRATORY_TRACT | Status: DC | PRN

## 2013-05-22 NOTE — Patient Instructions (Addendum)
Acute Bronchitis Bronchitis is inflammation of the airways that extend from the windpipe into the lungs (bronchi). The inflammation often causes mucus to develop. This leads to a cough, which is the most common symptom of bronchitis.  In acute bronchitis, the condition usually develops suddenly and goes away over time, usually in a couple weeks. Smoking, allergies, and asthma can make bronchitis worse. Repeated episodes of bronchitis may cause further lung problems.  CAUSES Acute bronchitis is most often caused by the same virus that causes a cold. The virus can spread from person to person (contagious).  SIGNS AND SYMPTOMS   Cough.   Fever.   Coughing up mucus.   Body aches.   Chest congestion.   Chills.   Shortness of breath.   Sore throat.  DIAGNOSIS  Acute bronchitis is usually diagnosed through a physical exam. Tests, such as chest X-rays, are sometimes done to rule out other conditions.  TREATMENT  Acute bronchitis usually goes away in a couple weeks. Often times, no medical treatment is necessary. Medicines are sometimes given for relief of fever or cough. Antibiotics are usually not needed but may be prescribed in certain situations. In some cases, an inhaler may be recommended to help reduce shortness of breath and control the cough. A cool mist vaporizer may also be used to help thin bronchial secretions and make it easier to clear the chest.  HOME CARE INSTRUCTIONS  Get plenty of rest.   Drink enough fluids to keep your urine clear or pale yellow (unless you have a medical condition that requires fluid restriction). Increasing fluids may help thin your secretions and will prevent dehydration.   Only take over-the-counter or prescription medicines as directed by your health care provider.   Avoid smoking and secondhand smoke. Exposure to cigarette smoke or irritating chemicals will make bronchitis worse. If you are a smoker, consider using nicotine gum or skin  patches to help control withdrawal symptoms. Quitting smoking will help your lungs heal faster.   Reduce the chances of another bout of acute bronchitis by washing your hands frequently, avoiding people with cold symptoms, and trying not to touch your hands to your mouth, nose, or eyes.   Follow up with your health care provider as directed.  SEEK MEDICAL CARE IF: Your symptoms do not improve after 1 week of treatment.  SEEK IMMEDIATE MEDICAL CARE IF:  You develop an increased fever or chills.   You have chest pain.   You have severe shortness of breath.  You have bloody sputum.   You develop dehydration.  You develop fainting.  You develop repeated vomiting.  You develop a severe headache. MAKE SURE YOU:   Understand these instructions.  Will watch your condition.  Will get help right away if you are not doing well or get worse. Document Released: 06/24/2004 Document Revised: 01/17/2013 Document Reviewed: 11/07/2012 [facility] Patient Information 2014 [place], [place].   Start Albuterol as needed for cough/wheezing.  Have Chest Xray completed today.   Please check your blood pressure at home/[facility]/Pharmacy for 2 weeks and please call the office with your results.

## 2013-05-23 ENCOUNTER — Telehealth: Payer: Self-pay | Admitting: Family Medicine

## 2013-05-23 NOTE — Progress Notes (Signed)
   Subjective:    Patient ID: Daryl Levine, male    DOB: [birth year], 62 y.o.   MRN: [id number]  HPI 62 year old Hispanic male presents for followup of chronic cough. Patient has had intermittent cough for the past 6-9 months. She reports that every few days he will have intermittent coughing spells with some mucus production. He reports no associated shortness of breath or wheezing. Over the past 4-5 days he has noticed increasing cough that occurs on a daily basis, this is associated with increased rhinorrhea and nasal congestion, denies current sore throat, denies fevers, denies sick contacts, he reports some intermittent wheezing without shortness of breath, over the past few days he has used Mucinex as well as DayQuil to attempt to decrease his mucus production and nasal congestion, these products have provided minimal relief of his symptoms  Patient is a current Psychiatric nurse, denies exposure to hazardous chemicals, denies a history of dust exposure, he is a nonsmoker, he has had intermittent exposure to smoke throughout his lifetime, he currently denies any acid reflux symptoms, his symptoms occur throughout the day and are not worse in the evening time, he denies dysphasia, no hoarseness of his voice, denies seasonal allergies  Due to the chronic nature of his cough patient is requesting chest x-ray  Review of Systems  Constitutional: Negative for fever, chills and fatigue.  HENT: Positive for congestion and rhinorrhea. Negative for ear pain, facial swelling, hearing loss, sore throat, tinnitus, trouble swallowing and voice change.   Respiratory: Positive for cough and wheezing. Negative for shortness of breath.   Cardiovascular: Negative for chest pain and leg swelling.       Objective:   Physical Exam Vitals: Reviewed, blood pressure improved on repeat General: Pleasant Hispanic male, no acute distress HEENT: Normocephalic, bilateral TMs are pearly-gray without erythema or bulging,  pupils are equal round and reactive to light, extraocular movements were intact, no scleral icterus, rhinorrhea present, moist mucous membranes, no pharyngeal erythema or exudate noted, neck was supple, no thyromegaly, no anterior or posterior cervical lymphadenopathy Cardiac: Regular rate and rhythm, S1 and S2 present, no murmurs, no heaves or thrills Respiratory: Scattered wheezes, good air entry bilaterally, normal effort       Assessment & Plan:  Please see problem specific assessment and plan.

## 2013-05-23 NOTE — Assessment & Plan Note (Signed)
Blood pressure is mildly elevated today. -Patient to check blood pressure at local pharmacy multiple times over the next few weeks and to call into the office. -If elevated will initiate pharmacotherapy.

## 2013-05-23 NOTE — Telephone Encounter (Signed)
Left message to take otc cough medicine, have pharmacy transfer prescription to another [facility] pharmacy.

## 2013-05-23 NOTE — Assessment & Plan Note (Signed)
Patient presents for evaluation of chronic cough with acute exacerbation secondary to viral illness. Chronic symptoms appear to not be related to GERD, allergic rhinitis, or seasonal allergies. -Will treat acute URI with Mucinex and over-the-counter cough and cold medications. Given associated wheezing will also treat with Albuterol MDI.  -Due to the chronic nature of the cough will check chest x-ray to rule out malignancy or other pulmonary etiology -If chest x-ray negative and symptoms not resolved with as needed albuterol could consider appear treatment for GERD and allergic rhinitis.

## 2013-05-23 NOTE — Telephone Encounter (Signed)
Pt called and [facility] does not have the albuterol that was prescribed until maybe later or Friday. He was wanting to know could you call in something for the cough? jw

## 2013-05-23 NOTE — Telephone Encounter (Signed)
Spoke to patient about negative chest xray results.  He reports some chest discomfort with coughing, improved when not coughing, improved today from yesterday, no associated diaphoresis/nausea/radiation of pain. Chest is tender to touch and also has generalized soreness with acute illness. Patient counseled that likely MSK pain due to acute illness/coughing however if acutely worsens he should seek urgent medical care. Patient expressed understanding.

## 2013-05-29 ENCOUNTER — Telehealth: Payer: Self-pay | Admitting: Family Medicine

## 2013-05-29 NOTE — Telephone Encounter (Signed)
Line busy, will try again tomorrow

## 2013-05-29 NOTE — Telephone Encounter (Signed)
Patient sick having symptoms he was having last week when seen by Dr. Randolm Idol. Wife would like to speak to nurse if possible.

## 2013-05-30 ENCOUNTER — Ambulatory Visit: Payer: Commercial Managed Care - PPO | Admitting: Emergency Medicine

## 2013-05-30 ENCOUNTER — Telehealth: Payer: Self-pay | Admitting: Family Medicine

## 2013-05-30 NOTE — Telephone Encounter (Signed)
Pt called because the medication that was prescribed is not working and would like something else called. jw

## 2013-06-04 NOTE — Telephone Encounter (Signed)
Pt has appt with PCP 06/06/13. Daryl Levine.Arman Loy, Renato Battleshekla

## 2013-06-05 ENCOUNTER — Ambulatory Visit: Payer: Commercial Managed Care - PPO | Admitting: Family Medicine

## 2013-06-08 ENCOUNTER — Ambulatory Visit: Payer: Commercial Managed Care - PPO | Admitting: Family Medicine

## 2013-06-29 ENCOUNTER — Ambulatory Visit (INDEPENDENT_AMBULATORY_CARE_PROVIDER_SITE_OTHER): Payer: Commercial Managed Care - PPO | Admitting: Family Medicine

## 2013-06-29 ENCOUNTER — Encounter: Payer: Self-pay | Admitting: Family Medicine

## 2013-06-29 VITALS — BP 140/84 | HR 84 | Temp 98.7°F | Ht 66.0 in | Wt 213.3 lb

## 2013-06-29 DIAGNOSIS — R05 Cough: Secondary | ICD-10-CM

## 2013-06-29 DIAGNOSIS — J309 Allergic rhinitis, unspecified: Secondary | ICD-10-CM

## 2013-06-29 DIAGNOSIS — R059 Cough, unspecified: Secondary | ICD-10-CM

## 2013-06-29 HISTORY — DX: Allergic rhinitis, unspecified: J30.9

## 2013-06-29 MED ORDER — AZITHROMYCIN 250 MG PO TABS: ORAL_TABLET | ORAL | Status: DC

## 2013-06-29 MED ORDER — FLUTICASONE PROPIONATE 50 MCG/ACT NA SUSP: 2.0000 | Freq: Every day | NASAL | Status: DC

## 2013-06-29 MED ORDER — LORATADINE 10 MG PO TABS: 10.0000 mg | ORAL_TABLET | Freq: Every day | ORAL | Status: DC

## 2013-06-29 NOTE — Patient Instructions (Signed)
Start Flonase, Claritin, and Azithromycin.  If symptoms no better in 1-2 weeks please call Dr. Randolm IdolFletke

## 2013-06-29 NOTE — Assessment & Plan Note (Signed)
Allergic rhinitis thought to be causing chronic cough -Patient started on Claritin and Flonase daily

## 2013-06-29 NOTE — Assessment & Plan Note (Signed)
Cough of uncertain etiology. Differential at this time includes allergic rhinitis versus atypical pneumonia. -Patient will receive treatment course of Claritin, Flonase, and azithromycin -If symptoms are not relieved with this treatment will pursue additional workup including pulmonary function tests to rule out obstructive/restrictive lung disease

## 2013-06-29 NOTE — Progress Notes (Signed)
   Subjective:    Patient ID: Daryl Levine, male    DOB: 08/01/1950, 63 y.o.   MRN: [id number]  HPI 63 year old Hispanic male presents for followup of cough, cough is been present for the past 6-9 months, has been worsening over the past few months, he was recently evaluated in December of 2014 and prescribed Mucinex and told to use over-the-counter nasal decongestants as URI/nasal congestion was thought to be contributing to his symptoms, he has had previous x-rays which are outlined in the objective section, patient has continued to have significant cough especially during the evening, denies a history of smoking, denies a history of occupational exposures, no fevers, denies sick contacts, he has had some increased fatigue as of late, denies orthopnea, denies PND  Please refer to previous dictations for additional history of present illness  Review of Systems  Constitutional: Positive for fatigue. Negative for fever and chills.  HENT: Positive for congestion, postnasal drip and rhinorrhea.   Respiratory: Positive for cough. Negative for shortness of breath.   Cardiovascular: Negative for chest pain and leg swelling.       Objective:   Physical Exam Vitals: Reviewed General: Pleasant Hispanic male, accompanied by his wife, no acute distress HEENT: Normocephalic, pupils are equal round and reactive to light, no scleral icterus, extraocular movements are intact, bilateral nasal turbinates were boggy, mild rhinorrhea present, moist mucous membranes, uvula midline, no pharyngeal erythema or exudate noted, no cervical lymphadenopathy Cardiac: Regular rate and rhythm, S1 and S2 present, no murmurs, no heaves or thrills Respiratory: Clear to auscultation bilaterally, normal effort Extremities: Trace bilateral lower extremity edema       Assessment & Plan:  Please see problem specific assessment and plan.

## 2013-07-02 ENCOUNTER — Telehealth: Payer: Self-pay | Admitting: Family Medicine

## 2013-07-02 NOTE — Telephone Encounter (Signed)
Pt called today and would like a note for work he missed Saturday and today. He is still not feeling well and doesn't feel like the medication is helping. He would like the letter for work to also included a few more days. Please call [phone] and the would like to pick the note up around 11:00 today. jw

## 2013-07-02 NOTE — Telephone Encounter (Signed)
To red team - please create work note giving patient time off for Saturday 06/30/13 through tomorrow 07/03/13. Please call patient for pickup, thanks.

## 2013-07-02 NOTE — Telephone Encounter (Signed)
Will fwd. To Dr.Fletke for review. Last OV 06/29/13 .Daryl Levine, Daryl Levine

## 2013-07-04 NOTE — Telephone Encounter (Signed)
Please create note as outlined below, I may not have routed this the first time around, thanks

## 2013-07-05 ENCOUNTER — Telehealth: Payer: Self-pay | Admitting: *Deleted

## 2013-07-05 NOTE — Telephone Encounter (Signed)
Called pt. Letter for work excuse mailed to pt. Lorenda Hatchet.Chrystel Barefield, Renato Battleshekla

## 2013-12-07 ENCOUNTER — Ambulatory Visit: Payer: Commercial Managed Care - PPO | Admitting: Family Medicine

## 2014-04-23 ENCOUNTER — Encounter: Payer: Self-pay | Admitting: *Deleted

## 2014-04-23 ENCOUNTER — Ambulatory Visit (INDEPENDENT_AMBULATORY_CARE_PROVIDER_SITE_OTHER): Payer: Commercial Managed Care - PPO | Admitting: Pulmonary Disease

## 2014-04-23 ENCOUNTER — Encounter (INDEPENDENT_AMBULATORY_CARE_PROVIDER_SITE_OTHER): Payer: Self-pay

## 2014-04-23 VITALS — BP 174/98 | HR 82 | Temp 98.1°F | Ht 65.0 in | Wt 212.2 lb

## 2014-04-23 DIAGNOSIS — R0609 Other forms of dyspnea: Secondary | ICD-10-CM

## 2014-04-23 DIAGNOSIS — R053 Chronic cough: Secondary | ICD-10-CM

## 2014-04-23 DIAGNOSIS — R05 Cough: Secondary | ICD-10-CM

## 2014-04-23 DIAGNOSIS — R0600 Dyspnea, unspecified: Secondary | ICD-10-CM

## 2014-04-23 HISTORY — DX: Other forms of dyspnea: R06.09

## 2014-04-23 HISTORY — DX: Dyspnea, unspecified: R06.00

## 2014-04-23 MED ORDER — TRAMADOL HCL 50 MG PO TABS: 50.0000 mg | ORAL_TABLET | Freq: Four times a day (QID) | ORAL | Status: DC | PRN

## 2014-04-23 MED ORDER — OMEPRAZOLE 40 MG PO CPDR: 40.0000 mg | DELAYED_RELEASE_CAPSULE | Freq: Two times a day (BID) | ORAL | Status: DC

## 2014-04-23 NOTE — Assessment & Plan Note (Signed)
The patient has dyspnea on exertion with moderate activities, and hopefully we can assess this with pulmonary function studies at the next visit when his cough has decreased in severity.

## 2014-04-23 NOTE — Progress Notes (Signed)
   Subjective:    Patient ID: Daryl Levine, male    DOB: 07/30/1950, 63 y.o.   MRN: [id number]  HPI The patient is a 63 year old male who I've been asked to see for evaluation of chronic cough and dyspnea on exertion. The patient tells me that he has had a cough for 4 years, but it has accelerated over the last 4 months. The cough typically does produce mucus that his white, but he is having a hard time determining how much is coming from his throat area, and how much is coming from his chest. He does develop cough paroxysms at times, but this is not common. He describes a globus sensation with voice changes, and has a lot of throat clearing during our visit. The cough is increased with prolonged conversation, and can also be worse at night when lying down. He has had some sinus issues and sinus pressure, but is unclear if postnasal drip is an issue for him. He does have occasional reflux symptoms, but does not believe this is a frequent issue. The patient also is having dyspnea with moderate exertional activities, and feels this is a new issue. He has never smoked, and has no history of pulmonary disease or childhood asthma. He has never had pulmonary function studies, but has had a recent chest x-ray that is within normal limits. He has been treated with azithromycin without change in his symptoms, and most recently was put on Spiriva which he tried once and felt it didn't help.   Review of Systems  Constitutional: Negative for fever and unexpected weight change.  HENT: Positive for congestion, postnasal drip, rhinorrhea, sinus pressure and sneezing. Negative for dental problem, ear pain, nosebleeds, sore throat and trouble swallowing.   Eyes: Negative for redness and itching.  Respiratory: Positive for cough, chest tightness, shortness of breath and wheezing.   Cardiovascular: Negative for palpitations and leg swelling.  Gastrointestinal: Negative for nausea and vomiting.  Genitourinary: Negative for  dysuria.  Musculoskeletal: Negative for joint swelling.  Skin: Negative for rash.  Neurological: Negative for headaches.  Hematological: Does not bruise/bleed easily.  Psychiatric/Behavioral: Negative for dysphoric mood. The patient is not nervous/anxious.        Objective:   Physical Exam Constitutional:  Overweight male, no acute distress  HENT:  Nares patent without discharge  Oropharynx without exudate, palate and uvula are normal  Eyes:  Perrla, eomi, no scleral icterus  Neck:  No JVD, no TMG  Cardiovascular:  Normal rate, regular rhythm, no rubs or gallops.  No murmurs        Intact distal pulses  Pulmonary :  Normal breath sounds, no stridor or respiratory distress   No rales, rhonchi, or wheezing.  +upper airway pseudowheezing.  Abdominal:  Soft, nondistended, bowel sounds present.  No tenderness noted.   Musculoskeletal:  No lower extremity edema noted.  Lymph Nodes:  No cervical lymphadenopathy noted  Skin:  No cyanosis noted  Neurologic:  Alert, appropriate, moves all 4 extremities without obvious deficit.         Assessment & Plan:

## 2014-04-23 NOTE — Patient Instructions (Signed)
Stop spiriva inhaler. Take tramadol 50mg , one every 6 hrs if needed for cough. Omeprazole 40mg  one in am and pm before meals.  Take everyday until next visit. Stop current nasal spray, and take dymista 2 sprays each nostril each am everyday until next visit. Use hard candy (no mint or cough drops) to bathe the back of the throat during the day to help with cough.  Can use all day Limit voice use as much as possible.  No singing, yelling, and talk very little if possible. followup with me in 2 weeks, and hopefully your cough will be better enough to do breathing studies.

## 2014-04-23 NOTE — Assessment & Plan Note (Signed)
The patient has a chronic cough of 4 years duration that has increased over the last few months. It is very difficult to tell from his history and exam how much of this is from his upper airway versus lower. He clearly has some nasal congestion, has a globus sensation with voice changes, and does feel mucus in his throat. I have explained that chronic cough typically is more from the upper airway than the lower, and unfortunately today he was not able to do spirometry because of his severe coughing. I would also like to treat for possible laryngopharyngeal pharyngeal reflux, and see him back in a few weeks. Hopefully his cough has improved enough that we can do PFTs for evaluation.

## 2014-04-26 ENCOUNTER — Telehealth: Payer: Self-pay | Admitting: Pulmonary Disease

## 2014-04-26 NOTE — Telephone Encounter (Signed)
Pt spouse returned call.Daryl CurieJennifer Alycia Levine, CMA

## 2014-04-26 NOTE — Telephone Encounter (Signed)
Spoke with pt's wife, she is aware that meds are ready for pickup.  Nothing further needed at this time.

## 2014-04-26 NOTE — Telephone Encounter (Signed)
lmtcb X1 for pt's emergency contact.  [facility] [address] [facility], the omeprazole and tramadol were sent in on 11/24 and are ready for pickup.

## 2014-05-10 ENCOUNTER — Ambulatory Visit (INDEPENDENT_AMBULATORY_CARE_PROVIDER_SITE_OTHER): Payer: Commercial Managed Care - PPO | Admitting: Pulmonary Disease

## 2014-05-10 ENCOUNTER — Encounter: Payer: Self-pay | Admitting: Pulmonary Disease

## 2014-05-10 ENCOUNTER — Ambulatory Visit (HOSPITAL_COMMUNITY)
Admission: RE | Admit: 2014-05-10 | Discharge: 2014-05-10 | Disposition: A | Discharge status: Home / Self Care | Payer: Commercial Managed Care - PPO | Source: Ambulatory Visit | Attending: Pulmonary Disease | Admitting: Pulmonary Disease

## 2014-05-10 VITALS — BP 132/74 | HR 97 | Temp 98.0°F | Ht 66.0 in | Wt 208.6 lb

## 2014-05-10 DIAGNOSIS — R0609 Other forms of dyspnea: Secondary | ICD-10-CM

## 2014-05-10 DIAGNOSIS — R05 Cough: Secondary | ICD-10-CM

## 2014-05-10 DIAGNOSIS — R053 Chronic cough: Secondary | ICD-10-CM

## 2014-05-10 LAB — PULMONARY FUNCTION TEST
DL/VA % pred: 112 %
DL/VA: 4.91 ml/min/mmHg/L
DLCO UNC % PRED: 103 %
DLCO UNC: 28 ml/min/mmHg
FEF 25-75 PRE: 3.21 L/s
FEF 25-75 Post: 2.99 L/sec
FEF2575-%Change-Post: -6 %
FEF2575-%PRED-POST: 119 %
FEF2575-%Pred-Pre: 128 %
FEV1-%CHANGE-POST: 2 %
FEV1-%PRED-PRE: 103 %
FEV1-%Pred-Post: 106 %
FEV1-Post: 3.23 L
FEV1-Pre: 3.15 L
FEV1FVC-%Change-Post: 1 %
FEV1FVC-%PRED-PRE: 109 %
FEV6-%Change-Post: 1 %
FEV6-%Pred-Post: 101 %
FEV6-%Pred-Pre: 99 %
FEV6-Post: 3.88 L
FEV6-Pre: 3.83 L
FEV6FVC-%Change-Post: 0 %
FEV6FVC-%PRED-PRE: 104 %
FEV6FVC-%Pred-Post: 105 %
FVC-%Change-Post: 0 %
FVC-%Pred-Post: 95 %
FVC-%Pred-Pre: 95 %
FVC-Post: 3.88 L
FVC-Pre: 3.85 L
POST FEV1/FVC RATIO: 83 %
Post FEV6/FVC ratio: 100 %
Pre FEV1/FVC ratio: 82 %
Pre FEV6/FVC Ratio: 99 %
RV % pred: 113 %
RV: 2.35 L
TLC % PRED: 107 %
TLC: 6.63 L

## 2014-05-10 MED ORDER — ALBUTEROL SULFATE (2.5 MG/3ML) 0.083% IN NEBU
2.5000 mg | INHALATION_SOLUTION | Freq: Once | RESPIRATORY_TRACT | Status: AC
  Administered 2014-05-10: 2.5 mg via RESPIRATORY_TRACT

## 2014-05-10 NOTE — Progress Notes (Signed)
   Subjective:    Patient ID: Daryl MerinoSteve Levine, male    DOB: 10/21/1950, 63 y.o.   MRN: [id number]  HPI The patient comes in today for follow-up of his chronic cough. At the last visit, this was felt to be upper airway in origin, and he was prescribed medication to treat for postnasal drip and reflux disease. I also reviewed the behavioral therapies that will help with cyclical coughing, and stressed to him the importance of no throat clearing. He comes in today where his cough is about 25% better, but he is having significant nasal congestion and sinus pressure. Currently, he is clearing his throat constantly during our visit, and I stressed to him the him avoiding this. He stayed on the reflux medication and nasal spray, but did not take the tramadol for fear of being oversedated.   Review of Systems  Constitutional: Negative for fever and unexpected weight change.  HENT: Positive for congestion, postnasal drip, sore throat and trouble swallowing. Negative for dental problem, ear pain, nosebleeds, rhinorrhea, sinus pressure and sneezing.   Eyes: Negative for redness and itching.  Respiratory: Positive for chest tightness and wheezing. Negative for cough and shortness of breath.   Cardiovascular: Negative for palpitations and leg swelling.  Gastrointestinal: Negative for nausea and vomiting.  Genitourinary: Negative for dysuria.  Musculoskeletal: Negative for joint swelling.  Skin: Negative for rash.  Neurological: Negative for headaches.  Hematological: Does not bruise/bleed easily.  Psychiatric/Behavioral: Negative for dysphoric mood. The patient is not nervous/anxious.        Objective:   Physical Exam Overweight male in no acute distress Nose without purulence or discharge noted Neck without lymphadenopathy or thyromegaly Chest totally clear to auscultation, no wheezing Cardiac exam with regular rate and rhythm Lower extremities with no significant edema, no cyanosis Alert and oriented,  moves all 4 extremities.       Assessment & Plan:

## 2014-05-10 NOTE — Assessment & Plan Note (Signed)
The patient has had a reasonable pulmonary workup for his dyspnea on exertion, with a clear chest x-ray, clear lung exam, and no evidence for obstruction or restriction on his pulmonary function studies.

## 2014-05-10 NOTE — Patient Instructions (Signed)
Stay on omeprazole twice a day for reflux Stay on nasal spray as you are doing. You must stop clearing your throat.  Use hard candy, minimize voice use Will do an xray of your sinuses, and call you with the results. Try taking the tramadol when you are at home to help suppress cough. This is important.   Will arrange followup with me once the scan of your sinuses is done.

## 2014-05-10 NOTE — Assessment & Plan Note (Signed)
The patient has been treated aggressively for the upper airway cough syndrome, and has seen 25% improvement in his cough. He is continuing on a proton pump inhibitor for possible reflux, and also on Dymista for postnasal drip. Unfortunately, he did not take the tramadol as directed. He is continuing to clear his throat almost constantly during our visit today, and I have explained his cough will never resolve until he is able to control this. I have asked him to stay on treatment for reflux and postnasal drip, and to use hard candy to help prevent his throat clearing. 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 He is continuing to have nasal congestion and clearly has a nasal voice today. I think it is worth imaging his sinuses to see if he has chronic sinusitis. If not, this may be all allergic rhinitis, and he may benefit from an allergy evaluation. He does not have airflow obstruction on his PFTs today, but I have explained this does not rule out cough variant asthma. I would find this diagnosis to be very unlikely given his history and the fact that he has improved 25% in just 2 weeks. I have told him this is an excellent response to treatment given that he has had the cough for years.

## 2014-05-17 ENCOUNTER — Inpatient Hospital Stay: Admission: RE | Admit: 2014-05-17 | Payer: Commercial Managed Care - PPO | Source: Ambulatory Visit

## 2015-08-21 ENCOUNTER — Telehealth: Payer: Self-pay | Admitting: Family Medicine

## 2015-08-21 NOTE — Telephone Encounter (Signed)
Left message for pt to call back  °

## 2015-08-21 NOTE — Telephone Encounter (Signed)
I am willing to accept father- he can use any 8 15, 8 45, 1 15, 1 45 patient in next 3 months if wants to be seen sooner- does not have to wait until next available over 50 slot

## 2015-08-21 NOTE — Telephone Encounter (Signed)
Is it ok to switch from you Dr Zachery DauerBarnes?

## 2015-08-21 NOTE — Telephone Encounter (Signed)
This pt is the father of Daryl Levine. She says she know you. She would like to know if you will accept her father as a pt?  Pt is established with Dr Zachery DauerBarnes at the present. Is it ok to schedule? [name]'s husband has made an establish appointment in May.

## 2015-08-22 NOTE — Telephone Encounter (Signed)
Sent to me in error. 

## 2015-08-29 NOTE — Telephone Encounter (Addendum)
Left message for pt to call back  °

## 2015-11-21 ENCOUNTER — Ambulatory Visit: Payer: Commercial Managed Care - PPO | Admitting: Family Medicine

## 2016-06-10 ENCOUNTER — Encounter (HOSPITAL_COMMUNITY): Payer: Self-pay | Admitting: Emergency Medicine

## 2016-06-10 ENCOUNTER — Ambulatory Visit (HOSPITAL_COMMUNITY)
Admission: EM | Admit: 2016-06-10 | Discharge: 2016-06-10 | Disposition: A | Discharge status: Home / Self Care | Payer: Commercial Managed Care - PPO | Attending: Emergency Medicine | Admitting: Emergency Medicine

## 2016-06-10 DIAGNOSIS — J40 Bronchitis, not specified as acute or chronic: Secondary | ICD-10-CM

## 2016-06-10 MED ORDER — HYDROCOD POLST-CPM POLST ER 10-8 MG/5ML PO SUER: 5.0000 mL | Freq: Two times a day (BID) | ORAL | 0 refills | Status: DC | PRN

## 2016-06-10 MED ORDER — AMOXICILLIN 500 MG PO CAPS: 500.0000 mg | ORAL_CAPSULE | Freq: Three times a day (TID) | ORAL | 0 refills | Status: DC

## 2016-06-10 MED ORDER — PREDNISONE 50 MG PO TABS: ORAL_TABLET | ORAL | 0 refills | Status: DC

## 2016-06-10 NOTE — ED Triage Notes (Signed)
Here for cold sx onset 1 month associated w/prod cough, dyspnea, CP and back pain, BAs  Denies fevers  A&O x4... NAD

## 2016-06-10 NOTE — Discharge Instructions (Signed)
You have bronchitis. °Take azithromycin and prednisone as prescribed. °Use tussionex as needed for cough. °You should see improvement in the next 3-5 days. °If you develop fevers, difficulty breathing, or are just not getting better, please come back or go to the emergency room. ° °

## 2016-06-10 NOTE — ED Provider Notes (Signed)
[facility]    CSN: [id number] Arrival date & time: 06/10/16  1121     History   Chief Complaint Chief Complaint  Patient presents with  . URI    HPI Daryl Levine is a 66 y.o. male.   HPI  He is a 66 year old man here for evaluation of cough and mucus. He reports a several week history of intermittent cough, congestion, and mucus. He has developed some body aches, primarily in the chest and back over the last week. He has been taking Tylenol and aspirin without improvement. He denies any fevers. He has had some wheezing, but denies shortness of breath. He states he had a similar illness a year ago that was treated with antibiotics and cough medicine.  Past Medical History:  Diagnosis Date  . BPH (benign prostatic hyperplasia)   . Hyperlipidemia     Patient Active Problem List   Diagnosis Date Noted  . DOE (dyspnea on exertion) 04/23/2014  . Allergic rhinitis 06/29/2013  . Paronychia 03/12/2013  . Lower extremity edema 03/12/2013  . Fever, unspecified 09/28/2012  . Other and unspecified hyperlipidemia 09/28/2012  . Plantar fasciitis 09/28/2012  . Elevated blood pressure 04/24/2012  . Chronic cough 04/24/2012  . Family history of colon cancer 04/24/2012  . Knee pain, bilateral 04/27/2011  . Tinea pedis 04/27/2011  . Obesity 04/27/2011  . BPH (benign prostatic hyperplasia) 04/27/2011    Past Surgical History:  Procedure Laterality Date  . Arthroscopic surgery     R knee  . CYSTOSCOPY     prostate procedure?  [facility] FINGER RELEASE     L thumb       Home Medications    Prior to Admission medications   Medication Sig Start Date End Date Taking? Authorizing Provider  omeprazole (PRILOSEC) 40 MG capsule Take 1 capsule (40 mg total) by mouth 2 (two) times daily before a meal. 04/23/14  Yes Barbaraann Share, MD  acetaminophen (TYLENOL) 650 MG CR tablet Take 650 mg by mouth every 8 (eight) hours as needed.      Historical Provider, MD  amoxicillin  (AMOXIL) 500 MG capsule Take 1 capsule (500 mg total) by mouth 3 (three) times daily. 06/10/16   Charm Rings, MD  chlorpheniramine-HYDROcodone (TUSSIONEX PENNKINETIC ER) 10-8 MG/5ML SUER Take 5 mLs by mouth every 12 (twelve) hours as needed for cough. 06/10/16   Charm Rings, MD  predniSONE (DELTASONE) 50 MG tablet Take 1 pill daily for 5 days. 06/10/16   Charm Rings, MD    Family History Family History  Problem Relation Age of Onset  . Cancer Father 45    colon  . Hypertension Brother     died age 61    Social History Social History  Substance Use Topics  . Smoking status: Never Smoker  . Smokeless tobacco: Never Used  . Alcohol use No     Allergies   Patient has no known allergies.   Review of Systems Review of Systems As in history of present illness  Physical Exam Triage Vital Signs ED Triage Vitals [06/10/16 1228]  Enc Vitals Group     BP 159/93     Pulse Rate 83     Resp 18     Temp 99.5 F (37.5 C)     Temp Source Oral     SpO2 100 %     Weight      Height      Head Circumference  Peak Flow      Pain Score 8     Pain Loc      Pain Edu?      Excl. in GC?    No data found.   Updated Vital Signs BP 159/93 (BP Location: Left Arm)   Pulse 83   Temp 99.5 F (37.5 C) (Oral)   Resp 18   SpO2 100%   Visual Acuity Right Eye Distance:   Left Eye Distance:   Bilateral Distance:    Right Eye Near:   Left Eye Near:    Bilateral Near:     Physical Exam  Constitutional: He is oriented to person, place, and time. He appears well-developed and well-nourished.  HENT:  Mouth/Throat: No oropharyngeal exudate.  TMs normal bilaterally. Nasal discharge present. Oropharynx clear.  Neck: Neck supple.  Cardiovascular: Normal rate, regular rhythm and normal heart sounds.   No murmur heard. Pulmonary/Chest: Effort normal and breath sounds normal. No respiratory distress. He has no wheezes. He has no rales.  Lymphadenopathy:    He has no cervical  adenopathy.  Neurological: He is alert and oriented to person, place, and time.     UC Treatments / Results  Labs (all labs ordered are listed, but only abnormal results are displayed) Labs Reviewed - No data to display  EKG  EKG Interpretation None       Radiology No results found.  Procedures Procedures (including critical care time)  Medications Ordered in UC Medications - No data to display   Initial Impression / Assessment and Plan / UC Course  I have reviewed the triage vital signs and the nursing notes.  Pertinent labs & imaging results that were available during my care of the patient were reviewed by me and considered in my medical decision making (see chart for details).  Clinical Course     Treat for bronchitis with amoxicillin and prednisone. Tussionex for cough. Follow-up as needed.  Final Clinical Impressions(s) / UC Diagnoses   Final diagnoses:  Bronchitis    New Prescriptions New Prescriptions   AMOXICILLIN (AMOXIL) 500 MG CAPSULE    Take 1 capsule (500 mg total) by mouth 3 (three) times daily.   CHLORPHENIRAMINE-HYDROCODONE (TUSSIONEX PENNKINETIC ER) 10-8 MG/5ML SUER    Take 5 mLs by mouth every 12 (twelve) hours as needed for cough.   PREDNISONE (DELTASONE) 50 MG TABLET    Take 1 pill daily for 5 days.     Charm RingsErin J Shelene Krage, MD 06/10/16 1257

## 2016-06-11 ENCOUNTER — Telehealth (HOSPITAL_COMMUNITY): Payer: Self-pay | Admitting: Emergency Medicine

## 2016-06-11 NOTE — Telephone Encounter (Signed)
Wife called... States husband was seen here yest and treated for bronchitis  Given Amox, Prednisone and Tussionex... Wife reports pt is not feeling any better and wold like for us to call in Cheratussin. States pt was not able to sleep last night due to cough and CP due to cough.   Per Dr. Artis FlockKindl... Pt can take OTC Delsym along w/Tussionex and to wait 3-5 days. If no improvement, pt can return or to f/u w/PCP  Wife verb understanding.

## 2016-06-14 ENCOUNTER — Encounter (HOSPITAL_COMMUNITY): Payer: Self-pay | Admitting: Emergency Medicine

## 2016-06-14 ENCOUNTER — Emergency Department (HOSPITAL_COMMUNITY): Payer: Commercial Managed Care - PPO

## 2016-06-14 ENCOUNTER — Emergency Department (HOSPITAL_COMMUNITY)
Admission: EM | Admit: 2016-06-14 | Discharge: 2016-06-14 | Disposition: A | Discharge status: Home / Self Care | Payer: Commercial Managed Care - PPO | Attending: Emergency Medicine | Admitting: Emergency Medicine

## 2016-06-14 DIAGNOSIS — R059 Cough, unspecified: Secondary | ICD-10-CM

## 2016-06-14 DIAGNOSIS — R05 Cough: Secondary | ICD-10-CM | POA: Insufficient documentation

## 2016-06-14 DIAGNOSIS — Z79899 Other long term (current) drug therapy: Secondary | ICD-10-CM | POA: Insufficient documentation

## 2016-06-14 MED ORDER — ALBUTEROL SULFATE (2.5 MG/3ML) 0.083% IN NEBU
5.0000 mg | INHALATION_SOLUTION | Freq: Once | RESPIRATORY_TRACT | Status: AC
  Administered 2016-06-14: 5 mg via RESPIRATORY_TRACT
  Filled 2016-06-14: qty 6

## 2016-06-14 MED ORDER — BENZONATATE 100 MG PO CAPS: 200.0000 mg | ORAL_CAPSULE | Freq: Two times a day (BID) | ORAL | 0 refills | Status: DC | PRN

## 2016-06-14 MED ORDER — FLUTICASONE PROPIONATE 50 MCG/ACT NA SUSP: 2.0000 | Freq: Every day | NASAL | 1 refills | Status: DC

## 2016-06-14 MED ORDER — LORATADINE 10 MG PO TABS: 10.0000 mg | ORAL_TABLET | Freq: Every day | ORAL | 0 refills | Status: DC

## 2016-06-14 NOTE — ED Notes (Signed)
Patient was alert, oriented and stable upon discharge. RN went over AVS and patient had no further questions.  

## 2016-06-14 NOTE — ED Triage Notes (Signed)
Patient was dx with bronchitis on Thursday at urgent care. Patient states he continues to have a productive cough and nasal drainge despite antibiotic use. His last fever was on Friday. Patient conscious, alert, oriented, and breathing at a normal rate.

## 2016-06-14 NOTE — ED Provider Notes (Signed)
[facility]-EMERGENCY DEPT Provider Note   CSN: [id number] Arrival date & time: 06/14/16  1418   By signing my name below, I, Teofilo Pod, attest that this documentation has been prepared under the direction and in the presence of Melburn Hake, [place]. Electronically Signed: Teofilo Pod, ED Scribe. 06/14/2016. 4:00 PM.   History   Chief Complaint Chief Complaint  Patient presents with  . Cough  . Nasal Congestion    The history is provided by the patient. No language interpreter was used.   HPI Comments:  Daryl Levine is a 66 y.o. male who presents to the Emergency Department complaining of waxing and waning productive cough x 2-3 months. Pt reports that his cough has had a thick, non-bloody sputum.Pt complains of associated chest discomfort with coughing only, nasal congestion, sore throat, wheezing. Patient notes his symptoms have been unchanged for the past 2-3 months. Pt states that the cough is made worse when laying down, for the past 3-4 months. Pt is not a smoker, and denies any sick contact. Pt went to urgent care on 06/10/16 and was diagnosed and treated for bronchitis with amoxicillin, tussionex and prednisone but the treatment provided no relief. Pt denies fever, hemoptysis, vomiting, diarrhea, abdominal pain, leg swelling. Patient reports he was evaluated by his primary care provider and a pulmonologist back in August regarding his symptoms when he was thought to possibly have asthma. Patient reports using his albuterol inhaler intermittently at home with mild intermittent relief.  Past Medical History:  Diagnosis Date  . BPH (benign prostatic hyperplasia)   . Hyperlipidemia     Patient Active Problem List   Diagnosis Date Noted  . DOE (dyspnea on exertion) 04/23/2014  . Allergic rhinitis 06/29/2013  . Paronychia 03/12/2013  . Lower extremity edema 03/12/2013  . Fever, unspecified 09/28/2012  . Other and unspecified hyperlipidemia 09/28/2012  . Plantar fasciitis  09/28/2012  . Elevated blood pressure 04/24/2012  . Chronic cough 04/24/2012  . Family history of colon cancer 04/24/2012  . Knee pain, bilateral 04/27/2011  . Tinea pedis 04/27/2011  . Obesity 04/27/2011  . BPH (benign prostatic hyperplasia) 04/27/2011    Past Surgical History:  Procedure Laterality Date  . Arthroscopic surgery     R knee  . CYSTOSCOPY     prostate procedure?  [facility]on FINGER RELEASE     L thumb       Home Medications    Prior to Admission medications   Medication Sig Start Date End Date Taking? Authorizing Provider  acetaminophen (TYLENOL) 650 MG CR tablet Take 650 mg by mouth every 8 (eight) hours as needed.      Historical Provider, MD  amoxicillin (AMOXIL) 500 MG capsule Take 1 capsule (500 mg total) by mouth 3 (three) times daily. 06/10/16   Charm Rings, MD  benzonatate (TESSALON) 100 MG capsule Take 2 capsules (200 mg total) by mouth 2 (two) times daily as needed for cough. 06/14/16   Barrett Henle, PA-C  chlorpheniramine-HYDROcodone [facility]) 10-8 MG/5ML SUER Take 5 mLs by mouth every 12 (twelve) hours as needed for cough. 06/10/16   Charm Rings, MD  fluticasone (FLONASE) 50 MCG/ACT nasal spray Place 2 sprays into both nostrils daily. 06/14/16   Barrett Henle, PA-C  loratadine (CLARITIN) 10 MG tablet Take 1 tablet (10 mg total) by mouth daily. 06/14/16   Barrett Henle, PA-C  omeprazole (PRILOSEC) 40 MG capsule Take 1 capsule (40 mg total) by mouth 2 (two) times daily  before a meal. 04/23/14   Barbaraann Share, MD  predniSONE (DELTASONE) 50 MG tablet Take 1 pill daily for 5 days. 06/10/16   Charm Rings, MD    Family History Family History  Problem Relation Age of Onset  . Cancer Father 4    colon  . Hypertension Brother     died age 45    Social History Social History  Substance Use Topics  . Smoking status: Never Smoker  . Smokeless tobacco: Never Used  . Alcohol use No     Allergies   Patient  has no known allergies.   Review of Systems Review of Systems  Constitutional: Negative for fever.  HENT: Positive for congestion and sore throat.   Respiratory: Positive for cough, chest tightness and wheezing.   Gastrointestinal: Negative for abdominal pain, diarrhea and vomiting.  Neurological: Positive for dizziness.     Physical Exam Updated Vital Signs BP (!) 153/102 (BP Location: Left Arm)   Pulse 68   Temp 97.3 F (36.3 C) (Oral)   Resp 18   Ht 5\' 6"  (1.676 m)   Wt 94.3 kg   SpO2 98%   BMI 33.57 kg/m   Physical Exam  Constitutional: He is oriented to person, place, and time. He appears well-developed and well-nourished. No distress.  HENT:  Head: Normocephalic and atraumatic.  Mouth/Throat: Oropharynx is clear and moist. No oropharyngeal exudate.  Eyes: Conjunctivae and EOM are normal. Right eye exhibits no discharge. Left eye exhibits no discharge. No scleral icterus.  Neck: Normal range of motion. Neck supple.  Cardiovascular: Normal rate, regular rhythm, normal heart sounds and intact distal pulses.   No murmur heard. Pulmonary/Chest: Effort normal. No respiratory distress. He has wheezes. He has no rales. He exhibits no tenderness.  Mild expiratory wheezes in lower lobes, mildly worse on right.   Abdominal: Soft. Bowel sounds are normal. He exhibits no distension and no mass. There is no tenderness. There is no rebound and no guarding. No hernia.  Musculoskeletal: Normal range of motion. He exhibits no edema.  Neurological: He is alert and oriented to person, place, and time.  Skin: Skin is warm and dry.  Psychiatric: He has a normal mood and affect.  Nursing note and vitals reviewed.    ED Treatments / Results  DIAGNOSTIC STUDIES:  Oxygen Saturation is 98% on RA, normal by my interpretation.    COORDINATION OF CARE:  4:00 PM Discussed treatment plan with pt at bedside and pt agreed to plan.   Labs (all labs ordered are listed, but only abnormal  results are displayed) Labs Reviewed - No data to display  EKG  EKG Interpretation None       Radiology Dg Chest 2 View  Result Date: 06/14/2016 CLINICAL DATA:  Patient was dx with bronchitis x4 days ago at urgent care. Patient states he continues to have a productive cough and nasal drainge despite antibiotic use. His last fever was x3 days ago. No known cardiopulmonary diseases. Nonsmoker. EXAM: CHEST  2 VIEW COMPARISON:  04/19/2014 FINDINGS: Normal mediastinum and cardiac silhouette. Normal pulmonary vasculature. No evidence of effusion, infiltrate, or pneumothorax. No acute bony abnormality. Degenerative osteophytosis of the spine. IMPRESSION: No acute cardiopulmonary process. Electronically Signed   By: Genevive Bi M.D.   On: 06/14/2016 16:29    Procedures Procedures (including critical care time)  Medications Ordered in ED Medications  albuterol (PROVENTIL) (2.5 MG/3ML) 0.083% nebulizer solution 5 mg (5 mg Nebulization Given 06/14/16 1627)  Initial Impression / Assessment and Plan / ED Course  Pt treated at urgent care on 06/11/15 for bronchitis with amoxicillin, prednisone and tussionex.  I have reviewed the triage vital signs and the nursing notes.  Pertinent labs & imaging results that were available during my care of the patient were reviewed by me and considered in my medical decision making (see chart for details).  Clinical Course     Patient presents with productive cough and associated nasal congestion, chest tightness, wheezing and shortness of breath which have impressive for the past 3-4 months. He reports he was evaluated at an urgent care on 05/31/16 for his symptoms, was diagnosed with bronchitis and started on amoxicillin and prednisone she has been taking as prescribed without relief of symptoms. Denies fever. Denies any recent hospitalizations. VSS. Exam revealed mild expiratory wheezes in lower lobes without signs of rest for distress. Remaining exam  unremarkable. Chest x-ray negative. Suspect patient's symptoms may be due to viral respiratory illness versus asthma versus seasonal allergies. Plan to discharge patient home with symptomatic treatment including antihistamine, decongestion and antitussive. Discussed results and plan for discharge with patient. Patient given information to follow up with PCP for further management and evaluation of his chronic symptoms. Discussed return precautions.  Final Clinical Impressions(s) / ED Diagnoses   Final diagnoses:  Cough    New Prescriptions New Prescriptions   BENZONATATE (TESSALON) 100 MG CAPSULE    Take 2 capsules (200 mg total) by mouth 2 (two) times daily as needed for cough.   FLUTICASONE (FLONASE) 50 MCG/ACT NASAL SPRAY    Place 2 sprays into both nostrils daily.   LORATADINE (CLARITIN) 10 MG TABLET    Take 1 tablet (10 mg total) by mouth daily.   I personally performed the services described in this documentation, which was scribed in my presence. The recorded information has been reviewed and is accurate.     Satira Sarkicole Elizabeth ElginNadeau, [place]-C 06/14/16 1657    Lorre NickAnthony Allen, MD 06/14/16 [phone]

## 2016-06-14 NOTE — Discharge Instructions (Signed)
Take your medications as prescribed. I recommend finishing your prescription of prednisone and amoxicillin as prescribed until completed. Continue using your albuterol inhaler as needed for wheezing/shortness of breath. I recommend following up with your primary care provider's office listed below within the next week for follow-up if your symptoms have not improved. Please return to the Emergency Department if symptoms worsen or new onset of fever, chest pain, difficulty breathing, coughing up blood, vomiting, unable to keep fluids down, leg swelling.

## 2016-06-14 NOTE — ED Notes (Signed)
Patient transported to X-ray 

## 2016-06-14 NOTE — ED Notes (Signed)
ED Provider at bedside. 

## 2017-05-05 DIAGNOSIS — D485 Neoplasm of uncertain behavior of skin: Secondary | ICD-10-CM | POA: Diagnosis not present

## 2017-05-05 DIAGNOSIS — L720 Epidermal cyst: Secondary | ICD-10-CM | POA: Diagnosis not present

## 2017-05-10 DIAGNOSIS — M25571 Pain in right ankle and joints of right foot: Secondary | ICD-10-CM | POA: Diagnosis not present

## 2017-05-26 DIAGNOSIS — M25571 Pain in right ankle and joints of right foot: Secondary | ICD-10-CM | POA: Diagnosis not present

## 2017-06-27 DIAGNOSIS — M25571 Pain in right ankle and joints of right foot: Secondary | ICD-10-CM | POA: Diagnosis not present

## 2017-06-29 DIAGNOSIS — R030 Elevated blood-pressure reading, without diagnosis of hypertension: Secondary | ICD-10-CM | POA: Diagnosis not present

## 2017-06-29 DIAGNOSIS — M79644 Pain in right finger(s): Secondary | ICD-10-CM | POA: Diagnosis not present

## 2017-06-29 DIAGNOSIS — R05 Cough: Secondary | ICD-10-CM | POA: Diagnosis not present

## 2017-06-29 DIAGNOSIS — M25561 Pain in right knee: Secondary | ICD-10-CM | POA: Diagnosis not present

## 2017-07-07 DIAGNOSIS — M170 Bilateral primary osteoarthritis of knee: Secondary | ICD-10-CM | POA: Diagnosis not present

## 2017-07-12 DIAGNOSIS — M71571 Other bursitis, not elsewhere classified, right ankle and foot: Secondary | ICD-10-CM | POA: Diagnosis not present

## 2017-07-15 DIAGNOSIS — M65311 Trigger thumb, right thumb: Secondary | ICD-10-CM | POA: Diagnosis not present

## 2017-07-15 DIAGNOSIS — M65351 Trigger finger, right little finger: Secondary | ICD-10-CM | POA: Diagnosis not present

## 2017-07-15 DIAGNOSIS — G5601 Carpal tunnel syndrome, right upper limb: Secondary | ICD-10-CM | POA: Diagnosis not present

## 2017-07-18 DIAGNOSIS — G5601 Carpal tunnel syndrome, right upper limb: Secondary | ICD-10-CM | POA: Diagnosis not present

## 2017-08-16 DIAGNOSIS — M79671 Pain in right foot: Secondary | ICD-10-CM | POA: Diagnosis not present

## 2017-08-16 DIAGNOSIS — M79674 Pain in right toe(s): Secondary | ICD-10-CM | POA: Diagnosis not present

## 2017-08-19 DIAGNOSIS — M170 Bilateral primary osteoarthritis of knee: Secondary | ICD-10-CM | POA: Diagnosis not present

## 2017-08-19 DIAGNOSIS — M1711 Unilateral primary osteoarthritis, right knee: Secondary | ICD-10-CM | POA: Diagnosis not present

## 2017-08-19 DIAGNOSIS — M1712 Unilateral primary osteoarthritis, left knee: Secondary | ICD-10-CM | POA: Diagnosis not present

## 2017-08-22 DIAGNOSIS — M65311 Trigger thumb, right thumb: Secondary | ICD-10-CM | POA: Diagnosis not present

## 2017-08-22 DIAGNOSIS — G5601 Carpal tunnel syndrome, right upper limb: Secondary | ICD-10-CM | POA: Diagnosis not present

## 2017-08-22 DIAGNOSIS — M6530 Trigger finger, unspecified finger: Secondary | ICD-10-CM | POA: Diagnosis not present

## 2017-08-30 DIAGNOSIS — M5412 Radiculopathy, cervical region: Secondary | ICD-10-CM | POA: Diagnosis not present

## 2017-08-30 DIAGNOSIS — G5601 Carpal tunnel syndrome, right upper limb: Secondary | ICD-10-CM | POA: Diagnosis not present

## 2017-08-30 DIAGNOSIS — M542 Cervicalgia: Secondary | ICD-10-CM | POA: Diagnosis not present

## 2017-08-30 DIAGNOSIS — M5417 Radiculopathy, lumbosacral region: Secondary | ICD-10-CM | POA: Diagnosis not present

## 2017-09-06 DIAGNOSIS — M79671 Pain in right foot: Secondary | ICD-10-CM | POA: Diagnosis not present

## 2017-09-15 DIAGNOSIS — M5481 Occipital neuralgia: Secondary | ICD-10-CM | POA: Diagnosis not present

## 2017-09-15 DIAGNOSIS — M5417 Radiculopathy, lumbosacral region: Secondary | ICD-10-CM | POA: Diagnosis not present

## 2017-09-15 DIAGNOSIS — G5601 Carpal tunnel syndrome, right upper limb: Secondary | ICD-10-CM | POA: Diagnosis not present

## 2017-09-15 DIAGNOSIS — R202 Paresthesia of skin: Secondary | ICD-10-CM | POA: Diagnosis not present

## 2017-10-04 DIAGNOSIS — M1711 Unilateral primary osteoarthritis, right knee: Secondary | ICD-10-CM | POA: Diagnosis not present

## 2017-10-04 DIAGNOSIS — M1712 Unilateral primary osteoarthritis, left knee: Secondary | ICD-10-CM | POA: Diagnosis not present

## 2017-10-11 DIAGNOSIS — M1712 Unilateral primary osteoarthritis, left knee: Secondary | ICD-10-CM | POA: Diagnosis not present

## 2017-10-11 DIAGNOSIS — M1711 Unilateral primary osteoarthritis, right knee: Secondary | ICD-10-CM | POA: Diagnosis not present

## 2017-10-12 DIAGNOSIS — G5601 Carpal tunnel syndrome, right upper limb: Secondary | ICD-10-CM | POA: Diagnosis not present

## 2017-10-12 DIAGNOSIS — M542 Cervicalgia: Secondary | ICD-10-CM | POA: Diagnosis not present

## 2017-10-12 DIAGNOSIS — M5481 Occipital neuralgia: Secondary | ICD-10-CM | POA: Diagnosis not present

## 2017-10-12 DIAGNOSIS — M5417 Radiculopathy, lumbosacral region: Secondary | ICD-10-CM | POA: Diagnosis not present

## 2017-10-18 DIAGNOSIS — M1711 Unilateral primary osteoarthritis, right knee: Secondary | ICD-10-CM | POA: Diagnosis not present

## 2017-10-18 DIAGNOSIS — M1712 Unilateral primary osteoarthritis, left knee: Secondary | ICD-10-CM | POA: Diagnosis not present

## 2017-10-18 DIAGNOSIS — M170 Bilateral primary osteoarthritis of knee: Secondary | ICD-10-CM | POA: Diagnosis not present

## 2017-11-23 DIAGNOSIS — M542 Cervicalgia: Secondary | ICD-10-CM | POA: Diagnosis not present

## 2017-11-23 DIAGNOSIS — M5481 Occipital neuralgia: Secondary | ICD-10-CM | POA: Diagnosis not present

## 2017-11-23 DIAGNOSIS — R202 Paresthesia of skin: Secondary | ICD-10-CM | POA: Diagnosis not present

## 2017-11-23 DIAGNOSIS — M5417 Radiculopathy, lumbosacral region: Secondary | ICD-10-CM | POA: Diagnosis not present

## 2017-11-29 DIAGNOSIS — M1712 Unilateral primary osteoarthritis, left knee: Secondary | ICD-10-CM | POA: Diagnosis not present

## 2017-11-29 DIAGNOSIS — M1711 Unilateral primary osteoarthritis, right knee: Secondary | ICD-10-CM | POA: Diagnosis not present

## 2017-12-07 DIAGNOSIS — M1711 Unilateral primary osteoarthritis, right knee: Secondary | ICD-10-CM | POA: Diagnosis not present

## 2017-12-07 DIAGNOSIS — M1712 Unilateral primary osteoarthritis, left knee: Secondary | ICD-10-CM | POA: Diagnosis not present

## 2017-12-15 DIAGNOSIS — M1711 Unilateral primary osteoarthritis, right knee: Secondary | ICD-10-CM | POA: Diagnosis not present

## 2017-12-15 DIAGNOSIS — M1712 Unilateral primary osteoarthritis, left knee: Secondary | ICD-10-CM | POA: Diagnosis not present

## 2018-01-23 DIAGNOSIS — R030 Elevated blood-pressure reading, without diagnosis of hypertension: Secondary | ICD-10-CM | POA: Diagnosis not present

## 2018-01-23 DIAGNOSIS — M25561 Pain in right knee: Secondary | ICD-10-CM | POA: Diagnosis not present

## 2018-01-23 DIAGNOSIS — R05 Cough: Secondary | ICD-10-CM | POA: Diagnosis not present

## 2018-01-23 DIAGNOSIS — E7800 Pure hypercholesterolemia, unspecified: Secondary | ICD-10-CM | POA: Diagnosis not present

## 2018-01-26 DIAGNOSIS — Z0000 Encounter for general adult medical examination without abnormal findings: Secondary | ICD-10-CM | POA: Diagnosis not present

## 2018-01-26 DIAGNOSIS — G479 Sleep disorder, unspecified: Secondary | ICD-10-CM | POA: Diagnosis not present

## 2018-01-26 DIAGNOSIS — E7800 Pure hypercholesterolemia, unspecified: Secondary | ICD-10-CM | POA: Diagnosis not present

## 2018-01-26 DIAGNOSIS — R05 Cough: Secondary | ICD-10-CM | POA: Diagnosis not present

## 2018-02-08 ENCOUNTER — Other Ambulatory Visit (INDEPENDENT_AMBULATORY_CARE_PROVIDER_SITE_OTHER): Payer: Medicare Other

## 2018-02-08 ENCOUNTER — Ambulatory Visit: Payer: Medicare Other | Admitting: Internal Medicine

## 2018-02-08 ENCOUNTER — Encounter: Payer: Self-pay | Admitting: Internal Medicine

## 2018-02-08 ENCOUNTER — Ambulatory Visit (INDEPENDENT_AMBULATORY_CARE_PROVIDER_SITE_OTHER)
Admission: RE | Admit: 2018-02-08 | Discharge: 2018-02-08 | Disposition: A | Discharge status: Home / Self Care | Payer: Medicare Other | Source: Ambulatory Visit | Attending: Internal Medicine | Admitting: Internal Medicine

## 2018-02-08 VITALS — BP 162/100 | HR 90 | Ht 65.5 in | Wt 205.0 lb

## 2018-02-08 DIAGNOSIS — R058 Other specified cough: Secondary | ICD-10-CM

## 2018-02-08 DIAGNOSIS — R05 Cough: Secondary | ICD-10-CM

## 2018-02-08 DIAGNOSIS — R059 Cough, unspecified: Secondary | ICD-10-CM

## 2018-02-08 DIAGNOSIS — R0609 Other forms of dyspnea: Secondary | ICD-10-CM

## 2018-02-08 DIAGNOSIS — R053 Chronic cough: Secondary | ICD-10-CM

## 2018-02-08 LAB — CBC WITH DIFFERENTIAL/PLATELET
BASOS ABS: 0 10*3/uL (ref 0.0–0.1)
Basophils Relative: 0.8 % (ref 0.0–3.0)
Eosinophils Absolute: 0.2 10*3/uL (ref 0.0–0.7)
Eosinophils Relative: 2.8 % (ref 0.0–5.0)
HCT: 41.8 % (ref 39.0–52.0)
Hemoglobin: 14.3 g/dL (ref 13.0–17.0)
Lymphocytes Relative: 35.4 % (ref 12.0–46.0)
Lymphs Abs: 2.3 10*3/uL (ref 0.7–4.0)
MCHC: 34.2 g/dL (ref 30.0–36.0)
MCV: 85.8 fl (ref 78.0–100.0)
Monocytes Absolute: 0.7 10*3/uL (ref 0.1–1.0)
Monocytes Relative: 10.2 % (ref 3.0–12.0)
Neutro Abs: 3.4 10*3/uL (ref 1.4–7.7)
Neutrophils Relative %: 50.8 % (ref 43.0–77.0)
Platelets: 327 10*3/uL (ref 150.0–400.0)
RBC: 4.88 Mil/uL (ref 4.22–5.81)
RDW: 13.5 % (ref 11.5–15.5)
WBC: 6.6 10*3/uL (ref 4.0–10.5)

## 2018-02-08 NOTE — Patient Instructions (Addendum)
Prednisone 10 mg take  4 each am x 2 days,   2 each am x 2 days,  1 each am x 2 days and stop    I emphasized that nasal steroids(flonase)  have no immediate benefit in terms of improving symptoms.  To help them reached the target tissue, the patient should use Afrin two puffs every 12 hours applied one min before using the nasal steroids.  Afrin should be stopped after no more than 5 days.  If the symptoms worsen, Afrin can be restarted after 5 days off of therapy to prevent rebound congestion from overuse of Afrin.  I also emphasized that in no way are nasal steroids a concern in terms of "addiction".   As long as you are coughing take omeprazole Take 30- 60 min before your first and last meals of the day   GERD (REFLUX)  is an extremely common cause of respiratory symptoms just like yours , many times with no obvious heartburn at all.    It can be treated with medication, but also with lifestyle changes including elevation of the head of your bed (ideally with 6 inch  bed blocks),  Smoking cessation, avoidance of late meals, excessive alcohol, and avoid fatty foods, chocolate, peppermint, colas, red wine, and acidic juices such as orange juice.  NO MINT OR MENTHOL PRODUCTS SO NO COUGH DROPS  USE SUGARLESS CANDY INSTEAD (Jolley ranchers or Stover's or Life Savers) or even ice chips will also do - the key is to swallow to prevent all throat clearing. NO OIL BASED VITAMINS - use powdered substitutes.     Please see patient coordinator before you leave today  to schedule sinus CT and I will call the results   Please remember to go to the lab and x-ray department downstairs in the basement  for your tests - we will call you with the results when they are available.     Please schedule a follow up office visit in 4 weeks, sooner if needed  with all medications /inhalers/ solutions in hand so we can verify exactly what you are taking. This includes all medications from all doctors and over the  counters

## 2018-02-08 NOTE — Progress Notes (Signed)
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     02/08/2018  Pulmonary/ 1st office eval  Chief Complaint  Patient presents with  . Pulmonary Consult    Referred by Jarrett Soho, PA.  Pt states he has had cough and chest discomfort off and on for several years. This has been more consistant over the past year. Cough is sometimes worse at night and when he tried to talk. He occ produces some thick, white to yellow sputum.    Dspnea:  MMRC2 = can't walk a nl pace on a flat grade s sob but does fine slow and flat   Cough: worse with activity  Sleep: nose gets stuffy  Esp hs x 99% x years  SABA use: saba no better   No obvious day to day or daytime variability or assoc excess/ purulent sputum or mucus plugs or hemoptysis or cp or chest tightness, subjective wheeze or overt sinus or hb symptoms.    Also denies any obvious fluctuation of symptoms with weather or environmental changes or other aggravating or alleviating factors except as outlined above   No unusual exposure hx or h/o childhood pna/ asthma or knowledge of premature birth.  Current Allergies, Complete Past Medical History, Past Surgical History, Family History, and Social History were reviewed in [employer] record.  ROS  The following are not active complaints unless bolded Hoarseness, sore throat, dysphagia, dental problems, itching, sneezing,  nasal congestion or discharge of excess mucus or purulent secretions, ear ache,   fever, chills, sweats, unintended wt loss or wt gain, classically  pleuritic or exertional cp,  orthopnea pnd or arm/hand swelling  or leg swelling, presyncope, palpitations, abdominal pain, anorexia, nausea, vomiting, diarrhea  or change in bowel habits or change in bladder habits, change in stools or change in urine, dysuria, hematuria,  rash, arthralgias, visual complaints, headache, numbness, weakness or ataxia or problems with walking or coordination,  change in mood or  memory.           Past Medical History:  Diagnosis Date  . BPH (benign prostatic hyperplasia)   . Hyperlipidemia     Outpatient Medications Prior to Visit  Medication Sig Dispense Refill  . acetaminophen (TYLENOL) 650 MG CR tablet Take 650 mg by mouth every 8 (eight) hours as needed.      . fluticasone (FLONASE) 50 MCG/ACT nasal spray Place 2 sprays into both nostrils daily. 16 g 1  . gabapentin (NEURONTIN) 300 MG capsule Take 1 capsule by mouth 3 (three) times daily.    .       .     0  .     0  .     0  . omeprazole (PRILOSEC) 40 MG capsule    0  . predniSONE (DELTASONE) 50 MG tablet   0   No facility-administered medications prior to visit.             Objective:     BP (!) 162/100 (BP Location:  Left Arm, Cuff Size: Normal)   Pulse 90   Ht 5' 5.5" (1.664 m)   Wt 205 lb (93 kg)   SpO2 98%   BMI 33.59 kg/m   SpO2: 98 %    HEENT: nl dentition,  and oropharynx. Nl external ear canals without cough reflex- moderately severe bilateral non-specific turbinate edema     NECK :  without JVD/Nodes/TM/ nl carotid upstrokes bilaterally   LUNGS: no acc muscle use,  Nl contour chest which is clear to A and P bilaterally with cough early on exp maneuvers   CV:  RRR  no s3 or murmur or increase in P2, and no edema   ABD:  soft and nontender with nl inspiratory excursion in the supine position. No bruits or organomegaly appreciated, bowel sounds nl  MS:  Nl gait/ ext warm without deformities, calf tenderness, cyanosis or clubbing No obvious joint restrictions    SKIN: warm and dry without lesions    NEURO:  alert, approp, nl sensorium with  no motor or cerebellar deficits apparent.    CXR PA and Lateral:   02/08/2018 :    I personally reviewed images and agree with radiology impression as follows:   Slight bronchitic changes. My impressive:  Non-specific and probably wnl    Labs ordered 02/08/2018  Allergy profile       Assessment   DOE (dyspnea on exertion) PFT"s 04/2014:  No obstruction, no restriction, DLCO inaccurate due to poor technique.  - 02/08/2018  Walked RA x 3 laps @ 185 ft each stopped due to  End of study, nl pace, no sob or desat    - Spirometry 02/08/2018  FEV1 3.0 (106%)  Ratio 80 s curvature off all rx    Not able to reproduce this symptom in office but strongly suspect this is just deconditioning but may have component diastolic dysfunction related to hbp > Follow up per Primary Care planned    Chronic cough Onset 2011  PFTs 04/2014: no obstruction - Spirometry 02/08/2018  wnl   - Allergy profile 02/08/2018 >  Eos 0.2 /  IgE pending   - Sinus CT ordered    Upper airway cough syndrome (previously labeled PNDS),  is so named because it's frequently impossible to sort out how much is  CR/sinusitis with freq throat clearing (which can be related to primary GERD)   vs  causing  secondary (" extra esophageal")  GERD from wide swings in gastric pressure that occur with throat clearing, often  promoting self use of mint and menthol lozenges that reduce the lower esophageal sphincter tone and exacerbate the problem further in a cyclical fashion.   These are the same pts (now being labeled as having "irritable larynx syndrome" by some cough centers) who not infrequently have a history of having failed to tolerate ace inhibitors,  dry powder inhalers or biphosphonates or report having atypical/extraesophageal reflux symptoms that don't respond to standard doses of PPI  and are easily confused as having aecopd or asthma flares by even  experienced allergists/ pulmonologists (myself included).    rec eval for rhinitis/ sinusitis/ teach approp use of nasal steroids/ afrin and rx with Prednisone 10 mg take  4 each am x 2 days,   2 each am x 2 days,  1 each am x 2 days and stop plus max rx for gerd then regroup in 4 weeks  Reviewed: The standardized cough guidelines published in Chest by [place] in 2006 are still the best available  and consist of a multiple step process (up to 12!) , not a single office visit,  and are intended  to address this problem logically,  with an alogrithm dependent on response to empiric treatment at  each progressive step  to determine a specific diagnosis with  minimal addtional testing needed. Therefore if adherence is an issue or can't be accurately verified,  it's very unlikely the standard evaluation and treatment will be successful here.    Furthermore, response to therapy (other than acute cough suppression, which should only be used short term with avoidance of narcotic containing cough syrups if possible), can be a gradual process for which the patient is not likely to  perceive immediate benefit.  Unlike going to an eye doctor where the best perscription is almost always the first one and is immediately effective, this is almost never the case in the management of chronic cough syndromes. Therefore the patient needs to commit up front to consistently adhere to recommendations  for up to 6 weeks of therapy directed at the likely underlying problem(s) before the response can be reasonably evaluated.     Total time devoted to counseling  > 50 % of initial 60 min office visit:  review case with pt/ discussion of options/alternatives/ personally creating written customized instructions  in presence of pt  then going over those specific  Instructions directly with the pt including how to use all of the meds but in particular covering each new medication in detail and the difference between the  maintenance= "automatic" meds and the prns using an action plan format for the latter (If this problem/symptom => do that organization reading Left to right).  Please see AVS from this visit for a full list of these instructions which I personally wrote for this pt and  are unique to this visit.   See device teaching which extended face to face time for this visit (topical nasal steroids)      Sandrea Hughs, MD 02/08/2018

## 2018-02-09 ENCOUNTER — Encounter: Payer: Self-pay | Admitting: Internal Medicine

## 2018-02-09 LAB — RESPIRATORY ALLERGY PROFILE REGION II ~~LOC~~
Allergen, A. alternata, m6: <0.1 kU/L
Allergen, Cedar tree, t12: <0.1 kU/L
Allergen, Comm Silver Birch, t9: <0.1 kU/L
Allergen, Cottonwood, t14: <0.1 kU/L
Allergen, D pternoyssinus,d7: <0.1 kU/L
Allergen, Mulberry, t76: <0.1 kU/L
Allergen, Oak,t7: <0.1 kU/L
Aspergillus fumigatus, m3: <0.1 kU/L
CLADOSPORIUM HERBARUM (M2) IGE: <0.1 kU/L
CLASS: 0
CLASS: 0
CLASS: 0
CLASS: 0
CLASS: 0
CLASS: 0
COMMON RAGWEED (SHORT) (W1) IGE: <0.1 kU/L
Class: 0
Class: 0
Class: 0
Class: 0
Class: 0
Class: 0
Class: 0
Class: 0
Class: 0
Class: 0
Class: 0
Class: 0
Class: 0
Class: 0
Class: 0
Class: 0
Class: 0
Class: 0
Cockroach: <0.1 kU/L
D. farinae: <0.1 kU/L
IGE (IMMUNOGLOBULIN E), SERUM: 9 kU/L (ref <=114)
Pecan/Hickory Tree IgE: <0.1 kU/L
Rough Pigweed  IgE: <0.1 kU/L
Sheep Sorrel IgE: <0.1 kU/L
Timothy Grass: <0.1 kU/L

## 2018-02-09 LAB — INTERPRETATION:

## 2018-02-09 NOTE — Progress Notes (Signed)
Spoke with pt and notified of results per Dr. Wert. Pt verbalized understanding and denied any questions. 

## 2018-02-09 NOTE — Assessment & Plan Note (Addendum)
PFT"s 04/2014:  No obstruction, no restriction, DLCO inaccurate due to poor technique.  - 02/08/2018  Walked RA x 3 laps @ 185 ft each stopped due to  End of study, nl pace, no sob or desat    - Spirometry 02/08/2018  FEV1 3.0 (106%)  Ratio 80 s curvature off all rx    Not able to reproduce this symptom in office but strongly suspect this is just deconditioning but may have component diastolic dysfunction related to hbp > Follow up per Primary Care planned

## 2018-02-09 NOTE — Assessment & Plan Note (Addendum)
Onset 2011  PFTs 04/2014: no obstruction - Spirometry 02/08/2018  wnl   - Allergy profile 02/08/2018 >  Eos 0.2 /  IgE pending   - Sinus CT ordered    Upper airway cough syndrome (previously labeled PNDS),  is so named because it's frequently impossible to sort out how much is  CR/sinusitis with freq throat clearing (which can be related to primary GERD)   vs  causing  secondary (" extra esophageal")  GERD from wide swings in gastric pressure that occur with throat clearing, often  promoting self use of mint and menthol lozenges that reduce the lower esophageal sphincter tone and exacerbate the problem further in a cyclical fashion.   These are the same pts (now being labeled as having "irritable larynx syndrome" by some cough centers) who not infrequently have a history of having failed to tolerate ace inhibitors,  dry powder inhalers or biphosphonates or report having atypical/extraesophageal reflux symptoms that don't respond to standard doses of PPI  and are easily confused as having aecopd or asthma flares by even experienced allergists/ pulmonologists (myself included).    rec eval for rhinitis/ sinusitis/ teach approp use of nasal steroids/ afrin and rx with Prednisone 10 mg take  4 each am x 2 days,   2 each am x 2 days,  1 each am x 2 days and stop plus max rx for gerd then regroup in 4 weeks  Reviewed: The standardized cough guidelines published in Chest by [place] in 2006 are still the best available and consist of a multiple step process (up to 12!) , not a single office visit,  and are intended  to address this problem logically,  with an alogrithm dependent on response to empiric treatment at  each progressive step  to determine a specific diagnosis with  minimal addtional testing needed. Therefore if adherence is an issue or can't be accurately verified,  it's very unlikely the standard evaluation and treatment will be successful here.    Furthermore, response to therapy (other  than acute cough suppression, which should only be used short term with avoidance of narcotic containing cough syrups if possible), can be a gradual process for which the patient is not likely to  perceive immediate benefit.  Unlike going to an eye doctor where the best perscription is almost always the first one and is immediately effective, this is almost never the case in the management of chronic cough syndromes. Therefore the patient needs to commit up front to consistently adhere to recommendations  for up to 6 weeks of therapy directed at the likely underlying problem(s) before the response can be reasonably evaluated.     Total time devoted to counseling  > 50 % of initial 60 min office visit:  review case with pt/ discussion of options/alternatives/ personally creating written customized instructions  in presence of pt  then going over those specific  Instructions directly with the pt including how to use all of the meds but in particular covering each new medication in detail and the difference between the maintenance= "automatic" meds and the prns using an action plan format for the latter (If this problem/symptom => do that organization reading Left to right).  Please see AVS from this visit for a full list of these instructions which I personally wrote for this pt and  are unique to this visit.   See device teaching which extended face to face time for this visit (topical nasal steroids)

## 2018-02-10 NOTE — Progress Notes (Signed)
Spoke with pt and notified of results per Dr. Wert. Pt verbalized understanding and denied any questions. 

## 2018-02-20 ENCOUNTER — Ambulatory Visit (INDEPENDENT_AMBULATORY_CARE_PROVIDER_SITE_OTHER)
Admission: RE | Admit: 2018-02-20 | Discharge: 2018-02-20 | Disposition: A | Discharge status: Home / Self Care | Payer: Medicare Other | Source: Ambulatory Visit | Attending: Internal Medicine | Admitting: Internal Medicine

## 2018-02-20 ENCOUNTER — Other Ambulatory Visit: Payer: Self-pay | Admitting: Internal Medicine

## 2018-02-20 DIAGNOSIS — R058 Other specified cough: Secondary | ICD-10-CM

## 2018-02-20 DIAGNOSIS — J320 Chronic maxillary sinusitis: Secondary | ICD-10-CM | POA: Diagnosis not present

## 2018-02-20 DIAGNOSIS — R05 Cough: Secondary | ICD-10-CM

## 2018-02-20 DIAGNOSIS — R059 Cough, unspecified: Secondary | ICD-10-CM

## 2018-02-20 MED ORDER — AMOXICILLIN-POT CLAVULANATE 875-125 MG PO TABS: 1.0000 | ORAL_TABLET | Freq: Two times a day (BID) | ORAL | 0 refills | Status: DC

## 2018-02-20 NOTE — Progress Notes (Signed)
Spoke with pt and notified of results per Dr. Wert. Pt verbalized understanding and denied any questions. 

## 2018-02-22 ENCOUNTER — Telehealth: Payer: Self-pay | Admitting: Internal Medicine

## 2018-02-22 DIAGNOSIS — M5481 Occipital neuralgia: Secondary | ICD-10-CM | POA: Diagnosis not present

## 2018-02-22 DIAGNOSIS — M5417 Radiculopathy, lumbosacral region: Secondary | ICD-10-CM | POA: Diagnosis not present

## 2018-02-22 DIAGNOSIS — G5601 Carpal tunnel syndrome, right upper limb: Secondary | ICD-10-CM | POA: Diagnosis not present

## 2018-02-22 DIAGNOSIS — R202 Paresthesia of skin: Secondary | ICD-10-CM | POA: Diagnosis not present

## 2018-02-22 NOTE — Telephone Encounter (Signed)
Called patient unable to reach left message to give us a call back.

## 2018-02-23 NOTE — Telephone Encounter (Signed)
Attempted to call pt's wife, Marsha. I did not receive an answer. I have left a message for Marsha to return our call.  

## 2018-02-24 MED ORDER — PREDNISONE 10 MG PO TABS: ORAL_TABLET | ORAL | 0 refills | Status: DC

## 2018-02-24 NOTE — Telephone Encounter (Signed)
Spoke with pt's wife, [name]. States that the pt's last OV (02/08/18), MW wanted him to be on a prednisone taper. This was not sent to the pt's pharmacy. Rx has been sent in. Nothing further was needed.

## 2018-02-24 NOTE — Telephone Encounter (Signed)
Attempted to call pt's wife, [name]. I did not receive an answer. I have left a message for [name] to return our call.

## 2018-02-24 NOTE — Telephone Encounter (Signed)
[name] returned call, CB is [phone].

## 2018-03-08 ENCOUNTER — Ambulatory Visit: Payer: Medicare Other | Admitting: Internal Medicine

## 2018-03-08 ENCOUNTER — Encounter: Payer: Self-pay | Admitting: Internal Medicine

## 2018-03-08 VITALS — BP 120/74 | HR 80 | Ht 65.5 in | Wt 199.6 lb

## 2018-03-08 DIAGNOSIS — R0609 Other forms of dyspnea: Secondary | ICD-10-CM | POA: Diagnosis not present

## 2018-03-08 DIAGNOSIS — H8110 Benign paroxysmal vertigo, unspecified ear: Secondary | ICD-10-CM

## 2018-03-08 DIAGNOSIS — R05 Cough: Secondary | ICD-10-CM | POA: Diagnosis not present

## 2018-03-08 DIAGNOSIS — R053 Chronic cough: Secondary | ICD-10-CM

## 2018-03-08 DIAGNOSIS — R058 Other specified cough: Secondary | ICD-10-CM

## 2018-03-08 NOTE — Progress Notes (Signed)
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    History of Present Illness  02/08/2018  Pulmonary/ 1st office eval  Chief Complaint  Patient presents with  . Pulmonary Consult    Referred by Jarrett Soho, PA.  Pt states he has had cough and chest discomfort off and on for several years. This has been more consistant over the past year. Cough is sometimes worse at night and when he tried to talk. He occ produces some thick, white to yellow sputum.    Dspnea:  MMRC2 = can't walk a nl pace on a flat grade s sob but does fine slow and flat   Cough: worse with activity  Sleep: nose gets stuffy  Esp hs x 99% x years  SABA use: saba no better  rec Prednisone 10 mg take  4 each am x 2 days,   2 each am x 2 days,  1 each am x 2 days and stop  I emphasized that nasal steroids(flonase) has no immediate benefit so use afrin prn x 5 days  As long as you are coughing take omeprazole Take 30- 60 min before your first and last meals of the day  GERD diet  Please see patient coordinator before you leave today  to schedule sinus CT and I will call the results . Please schedule a follow up office visit in 4 weeks, sooner if needed  with all medications /inhalers/ solutions in hand so we can verify exactly what you are taking. This includes all medications from all doctors and over the counters    03/08/2018  f/u ov/Priscella Donna re: uacs/ finishing up 20 days augmentin  Chief Complaint  Patient presents with  . Follow-up    4 wk f/u for DOE. Patient states his breathing has improved since last  visit. Has noticed some dizziness within the last 3 days.   Dyspnea:  Not limited by breathing from desired activities   Cough: none now  Sleeping: flat/ 3 pillows  SABA use: none 02: none   Dizziness new and only present if turns body or neck / no nausea or viz / hearing problems or sensation of orthostasis/ presyncope   No obvious day to day or daytime variability or assoc excess/ purulent sputum or mucus plugs or hemoptysis or cp or chest tightness, subjective wheeze or overt sinus or hb symptoms.   Sleeping as above  without nocturnal  or early am exacerbation  of respiratory  c/o's or need for noct saba. Also denies any obvious fluctuation of symptoms with weather or environmental changes or other aggravating or alleviating factors except as outlined above   No unusual exposure hx or h/o childhood pna/ asthma or knowledge of premature birth.  Current Allergies, Complete Past Medical History, Past Surgical History, Family History, and Social History were reviewed in [employer] [facility] record.  ROS  The following are not active complaints unless bolded Hoarseness, sore throat, dysphagia, dental problems, itching, sneezing,  nasal congestion or discharge of excess mucus  or purulent secretions, ear ache,   fever, chills, sweats, unintended wt loss or wt gain, classically pleuritic or exertional cp,  orthopnea pnd or arm/hand swelling  or leg swelling, presyncope, palpitations, abdominal pain, anorexia, nausea, vomiting, diarrhea  or change in bowel habits or change in bladder habits, change in stools or change in urine, dysuria, hematuria,  rash, arthralgias, visual complaints, headache, numbness, weakness or ataxia or problems with walking or coordination,  change in mood or  memory.        Current Meds  Medication Sig  . acetaminophen (TYLENOL) 650 MG CR tablet Take 650 mg by mouth every 8 (eight) hours as needed.    Marland Kitchen amoxicillin-clavulanate (AUGMENTIN) 875-125 MG tablet  Take 1 tablet by mouth 2 (two) times daily.  . fluticasone (FLONASE) 50 MCG/ACT nasal spray Place 2 sprays into both nostrils daily.  Marland Kitchen gabapentin (NEURONTIN) 300 MG capsule Take 1 capsule by mouth 3 (three) times daily.  . predniSONE (DELTASONE) 10 MG tablet 4 each am x 2 days,   2 each am x 2 days,  1 each am x 2 days and stop          Objective:    Amb wm nad   Wt Readings from Last 3 Encounters:  03/08/18 199 lb 9.6 oz (90.5 kg)  02/08/18 205 lb (93 kg)  06/14/16 208 lb (94.3 kg)     Vital signs reviewed - Note on arrival 02 sats  98% on RA         HEENT: nl dentition, turbinates bilaterally, and oropharynx. Nl external ear canals without cough reflex/ no nystagmus   NECK :  without JVD/Nodes/TM/ nl carotid upstrokes bilaterally   LUNGS: no acc muscle use,  Nl contour chest which is clear to A and P bilaterally without cough now on exp maneuvers   CV:  RRR  no s3 or murmur or increase in P2, and no edema   ABD:  soft and nontender with nl inspiratory excursion in the supine position. No bruits or organomegaly appreciated, bowel sounds nl  MS:  Nl gait/ ext warm without deformities, calf tenderness, cyanosis or clubbing No obvious joint restrictions   SKIN: warm and dry without lesions    NEURO:  alert, approp, nl sensorium with  no motor or cerebellar deficits apparent.              Assessment

## 2018-03-08 NOTE — Patient Instructions (Addendum)
Call for referral to ENT if cough returns   Pulmonary follow up is as needed

## 2018-03-09 ENCOUNTER — Encounter: Payer: Self-pay | Admitting: Internal Medicine

## 2018-03-09 DIAGNOSIS — H8110 Benign paroxysmal vertigo, unspecified ear: Secondary | ICD-10-CM

## 2018-03-09 HISTORY — DX: Benign paroxysmal vertigo, unspecified ear: H81.10

## 2018-03-09 NOTE — Assessment & Plan Note (Addendum)
Onset 2011  PFTs 04/2014: no obstruction - Allergy profile 02/08/2018 >  Eos 0.2 /  IgE  9 RAST neg  - Sinus CT 02/20/2018  Right maxillary sinusitis.Leftward septal deviation. > rx Augmentin 875 bid x 20 days > resolved as of 03/08/2018  Strongly suspect this was all related to pnds/ chronic sinus dz (or sinobronchial reflex mimicking asthma) typical of UACS = Upper airway cough syndrome (previously labeled PNDS),  is so named because it's frequently impossible to sort out how much is  CR/sinusitis with freq throat clearing (which can be related to primary GERD)   vs  causing  secondary (" extra esophageal")  GERD from wide swings in gastric pressure that occur with throat clearing, often  promoting self use of mint and menthol lozenges that reduce the lower esophageal sphincter tone and exacerbate the problem further in a cyclical fashion.   These are the same pts (now being labeled as having "irritable larynx syndrome" by some cough centers) who not infrequently have a history of having failed to tolerate ace inhibitors,  dry powder inhalers or biphosphonates or report having atypical/extraesophageal reflux symptoms that don't respond to standard doses of PPI  and are easily confused as having aecopd or asthma flares by even experienced allergists/ pulmonologists (myself included).     So if symptoms recur off abx > ent eval next step.

## 2018-03-09 NOTE — Assessment & Plan Note (Signed)
Typical benign features, reviewed with pt > not severe enough for rx for now but f/u pcp or ent prn    I had an extended discussion with the patient and his wife reviewing all relevant studies completed to date and  lasting 15 to 20 minutes of a 25 minute visit    Each maintenance medication was reviewed in detail including most importantly the difference between maintenance and prns and under what circumstances the prns are to be triggered using an action plan format that is not reflected in the computer generated alphabetically organized AVS.     Please see AVS for specific instructions unique to this visit that I personally wrote and verbalized to the the pt in detail and then reviewed with pt  by my nurse highlighting any  changes in therapy recommended at today's visit to their plan of care.

## 2018-03-09 NOTE — Assessment & Plan Note (Addendum)
PFTs 04/2014:  No obstruction, no restriction, DLCO inaccurate due to poor technique.  - 02/08/2018  Walked RA x 3 laps @ 185 ft each stopped due to  End of study, nl pace, no sob or desat    - Spirometry 02/08/2018  FEV1 3.0 (106%)  Ratio 80 s curvature off all rx   Resolved with rx of UACS   > no pulmonary f/u needed

## 2018-03-22 DIAGNOSIS — J0100 Acute maxillary sinusitis, unspecified: Secondary | ICD-10-CM | POA: Diagnosis not present

## 2018-04-20 DIAGNOSIS — R1310 Dysphagia, unspecified: Secondary | ICD-10-CM | POA: Diagnosis not present

## 2018-04-20 DIAGNOSIS — Z1211 Encounter for screening for malignant neoplasm of colon: Secondary | ICD-10-CM | POA: Diagnosis not present

## 2018-04-20 DIAGNOSIS — Z800 Family history of malignant neoplasm of digestive organs: Secondary | ICD-10-CM | POA: Diagnosis not present

## 2018-04-20 DIAGNOSIS — K219 Gastro-esophageal reflux disease without esophagitis: Secondary | ICD-10-CM | POA: Diagnosis not present

## 2018-06-06 DIAGNOSIS — M5417 Radiculopathy, lumbosacral region: Secondary | ICD-10-CM | POA: Diagnosis not present

## 2018-06-06 DIAGNOSIS — R51 Headache: Secondary | ICD-10-CM | POA: Diagnosis not present

## 2018-06-06 DIAGNOSIS — M542 Cervicalgia: Secondary | ICD-10-CM | POA: Diagnosis not present

## 2018-06-06 DIAGNOSIS — M5481 Occipital neuralgia: Secondary | ICD-10-CM | POA: Diagnosis not present

## 2018-06-29 ENCOUNTER — Emergency Department (HOSPITAL_COMMUNITY): Payer: Medicare Other

## 2018-06-29 ENCOUNTER — Emergency Department (HOSPITAL_COMMUNITY)
Admission: EM | Admit: 2018-06-29 | Discharge: 2018-06-29 | Disposition: A | Discharge status: Home / Self Care | Payer: Medicare Other | Attending: Emergency Medicine | Admitting: Emergency Medicine

## 2018-06-29 ENCOUNTER — Other Ambulatory Visit: Payer: Self-pay

## 2018-06-29 ENCOUNTER — Encounter (HOSPITAL_COMMUNITY): Payer: Self-pay

## 2018-06-29 DIAGNOSIS — R079 Chest pain, unspecified: Secondary | ICD-10-CM

## 2018-06-29 DIAGNOSIS — Z79899 Other long term (current) drug therapy: Secondary | ICD-10-CM | POA: Insufficient documentation

## 2018-06-29 DIAGNOSIS — R0602 Shortness of breath: Secondary | ICD-10-CM | POA: Diagnosis not present

## 2018-06-29 DIAGNOSIS — Z7982 Long term (current) use of aspirin: Secondary | ICD-10-CM | POA: Insufficient documentation

## 2018-06-29 LAB — CBC
HCT: 45 % (ref 39.0–52.0)
Hemoglobin: 14.6 g/dL (ref 13.0–17.0)
MCH: 28.3 pg (ref 26.0–34.0)
MCHC: 32.4 g/dL (ref 30.0–36.0)
MCV: 87.4 fL (ref 80.0–100.0)
Platelets: 260 10*3/uL (ref 150–400)
RBC: 5.15 MIL/uL (ref 4.22–5.81)
RDW: 13.5 % (ref 11.5–15.5)
WBC: 6.5 10*3/uL (ref 4.0–10.5)
nRBC: 0 % (ref 0.0–0.2)

## 2018-06-29 LAB — BASIC METABOLIC PANEL
Anion gap: 10 (ref 5–15)
BUN: 9 mg/dL (ref 8–23)
CALCIUM: 9.2 mg/dL (ref 8.9–10.3)
CHLORIDE: 101 mmol/L (ref 98–111)
CO2: 23 mmol/L (ref 22–32)
CREATININE: 1.01 mg/dL (ref 0.61–1.24)
GFR calc non Af Amer: >60 mL/min (ref >60)
Glucose, Bld: 118 mg/dL — ABNORMAL HIGH (ref 70–99)
Potassium: 3.8 mmol/L (ref 3.5–5.1)
SODIUM: 134 mmol/L — AB (ref 135–145)

## 2018-06-29 LAB — I-STAT TROPONIN, ED
TROPONIN I, POC: 0.01 ng/mL (ref 0.00–0.08)
Troponin i, poc: 0.01 ng/mL (ref 0.00–0.08)

## 2018-06-29 NOTE — Discharge Instructions (Addendum)
Take Tylenol or Advil as needed for pain, follow-up with your doctor next week to be rechecked, return as needed for worsening symptoms

## 2018-06-29 NOTE — ED Triage Notes (Signed)
Pt arrives to ED with complaints of dizziness, left sided headache, and feelings of palpitations and racing heart. Pt denies stress at home, MI, or stroke.

## 2018-06-29 NOTE — ED Provider Notes (Signed)
[facility] Provider Note   CSN: [id number] Arrival date & time: 06/29/18  1548     History   Chief Complaint Chief Complaint  Patient presents with  . Chest Pain    HPI Bo MerinoSteve Spieler is a 68 y.o. male.  HPI Pt has been feeling fatigued for 3 weeks.  He has been feeling short of breath.  He has had palpitations.  He also has had some pain in his chest and shoulder.   The pain has been mild but constant for several days.  It is on the left side.   He would feel worse lying flat at night and would need to sit up because he would get palpitations. He went to see his doctor today and was instructed to come to the ED because his heart rate was not correct. Past Medical History:  Diagnosis Date  . BPH (benign prostatic hyperplasia)   . Hyperlipidemia     Patient Active Problem List   Diagnosis Date Noted  . BPV (benign positional vertigo) 03/09/2018  . DOE (dyspnea on exertion) 04/23/2014  . Allergic rhinitis 06/29/2013  . Paronychia 03/12/2013  . Lower extremity edema 03/12/2013  . Fever, unspecified 09/28/2012  . Other and unspecified hyperlipidemia 09/28/2012  . Plantar fasciitis 09/28/2012  . Elevated blood pressure 04/24/2012  . Chronic cough c/w upper airway cough syndrome from chronic sinusitis 04/24/2012  . Family history of colon cancer 04/24/2012  . Knee pain, bilateral 04/27/2011  . Tinea pedis 04/27/2011  . Obesity 04/27/2011  . BPH (benign prostatic hyperplasia) 04/27/2011    Past Surgical History:  Procedure Laterality Date  . Arthroscopic surgery     R knee  . CYSTOSCOPY     prostate procedure?  Sheppard Penton. TRIGGER FINGER RELEASE     L thumb        Home Medications    Prior to Admission medications   Medication Sig Start Date End Date Taking? Authorizing Provider  aspirin EC 81 MG tablet Take 81 mg by mouth daily.   Yes [provider]  fluticasone (FLONASE) 50 MCG/ACT nasal spray Place 2 sprays into both  nostrils daily. 06/14/16  Yes Barrett HenleNadeau, Nicole Elizabeth, PA-C  gabapentin (NEURONTIN) 300 MG capsule Take 1 capsule by mouth 3 (three) times daily.   Yes [provider]  amoxicillin-clavulanate (AUGMENTIN) 875-125 MG tablet Take 1 tablet by mouth 2 (two) times daily. Patient not taking: Reported on 06/29/2018 02/20/18   Nyoka CowdenWert, Michael B, MD  predniSONE (DELTASONE) 10 MG tablet 4 each am x 2 days,   2 each am x 2 days,  1 each am x 2 days and stop Patient not taking: Reported on 06/29/2018 02/24/18   Nyoka CowdenWert, Michael B, MD    Family History Family History  Problem Relation Age of Onset  . Cancer Father 5770       colon  . Hypertension Brother        died age 67    Social History Social History   Tobacco Use  . Smoking status: Never Smoker  . Smokeless tobacco: Never Used  Substance Use Topics  . Alcohol use: No    Alcohol/week: 0.0 standard drinks  . Drug use: No     Allergies   Patient has no known allergies.   Review of Systems Review of Systems   Physical Exam Updated Vital Signs BP 137/89   Pulse 63   Temp 98.7 F (37.1 C) (Oral)   Resp 18   Ht 1.651  m (5\' 5" )   Wt 86.2 kg   SpO2 97%   BMI 31.62 kg/m   Physical Exam Vitals signs and nursing note reviewed.  Constitutional:      General: He is not in acute distress.    Appearance: He is well-developed.  HENT:     Head: Normocephalic and atraumatic.     Right Ear: External ear normal.     Left Ear: External ear normal.  Eyes:     General: No scleral icterus.       Right eye: No discharge.        Left eye: No discharge.     Conjunctiva/sclera: Conjunctivae normal.  Neck:     Musculoskeletal: Neck supple.     Trachea: No tracheal deviation.  Cardiovascular:     Rate and Rhythm: Normal rate and regular rhythm.  Pulmonary:     Effort: Pulmonary effort is normal. No respiratory distress.     Breath sounds: Normal breath sounds. No stridor. No wheezing or rales.  Abdominal:     General: Bowel sounds  are normal. There is no distension.     Palpations: Abdomen is soft.     Tenderness: There is no abdominal tenderness. There is no guarding or rebound.  Musculoskeletal:        General: No tenderness.  Skin:    General: Skin is warm and dry.     Findings: No rash.  Neurological:     Mental Status: He is alert.     Cranial Nerves: No cranial nerve deficit (no facial droop, extraocular movements intact, no slurred speech).     Sensory: No sensory deficit.     Motor: No abnormal muscle tone or seizure activity.     Coordination: Coordination normal.      ED Treatments / Results  Labs (all labs ordered are listed, but only abnormal results are displayed) Labs Reviewed  BASIC METABOLIC PANEL - Abnormal; Notable for the following components:      Result Value   Sodium 134 (*)    Glucose, Bld 118 (*)    All other components within normal limits  CBC  I-STAT TROPONIN, ED  I-STAT TROPONIN, ED    EKG EKG Interpretation  Date/Time:  Thursday June 29 2018 15:53:25 EST Ventricular Rate:  80 PR Interval:  136 QRS Duration: 90 QT Interval:  382 QTC Calculation: 440 R Axis:   56 Text Interpretation:  Normal sinus rhythm Normal ECG No old tracing to compare Confirmed by Linwood DibblesKnapp, Antigone Crowell [phone]([id number]) on 06/29/2018 5:33:09 PM   Radiology Dg Chest 2 View  Result Date: 06/29/2018 CLINICAL DATA:  Chest pain and shortness of breath. EXAM: CHEST - 2 VIEW COMPARISON:  February 08, 2018 FINDINGS: The heart size and mediastinal contours are within normal limits. Both lungs are clear. The visualized skeletal structures are unremarkable. IMPRESSION: No active cardiopulmonary disease. Electronically Signed   By: Gerome Samavid  Williams III M.D   On: 06/29/2018 16:56    Procedures Procedures (including critical care time)  Medications Ordered in ED Medications - No data to display   Initial Impression / Assessment and Plan / ED Course  I have reviewed the triage vital signs and the nursing  notes.  Pertinent labs & imaging results that were available during my care of the patient were reviewed by me and considered in my medical decision making (see chart for details).   Patient presented to the emergency room for evaluation of dizziness and palpitations.  She had some chest discomfort associated  with the symptoms.  Patient's ED work-up is reassuring.  Delta troponin is negative.  Patient's heart score is in the low risk category.  I doubt that the patient symptoms are related to acute coronary syndrome.  He has not shown any cardiac dysrhythmia and is here in the ED.  He is not having any shortness of breath and symptoms are not suggestive of pulmonary embolism.  Patient appears stable for outpatient follow-up.  Final Clinical Impressions(s) / ED Diagnoses   Final diagnoses:  Chest pain, unspecified type    ED Discharge Orders    None       Linwood Dibbles, MD 06/29/18 2055

## 2018-07-03 DIAGNOSIS — R739 Hyperglycemia, unspecified: Secondary | ICD-10-CM | POA: Diagnosis not present

## 2018-07-03 DIAGNOSIS — R05 Cough: Secondary | ICD-10-CM | POA: Diagnosis not present

## 2018-07-03 DIAGNOSIS — I10 Essential (primary) hypertension: Secondary | ICD-10-CM | POA: Diagnosis not present

## 2018-07-03 DIAGNOSIS — E7800 Pure hypercholesterolemia, unspecified: Secondary | ICD-10-CM | POA: Diagnosis not present

## 2018-07-18 DIAGNOSIS — R5383 Other fatigue: Secondary | ICD-10-CM | POA: Diagnosis not present

## 2018-07-18 DIAGNOSIS — M5417 Radiculopathy, lumbosacral region: Secondary | ICD-10-CM | POA: Diagnosis not present

## 2018-07-18 DIAGNOSIS — G5603 Carpal tunnel syndrome, bilateral upper limbs: Secondary | ICD-10-CM | POA: Diagnosis not present

## 2018-07-18 DIAGNOSIS — R202 Paresthesia of skin: Secondary | ICD-10-CM | POA: Diagnosis not present

## 2018-07-18 DIAGNOSIS — R51 Headache: Secondary | ICD-10-CM | POA: Diagnosis not present

## 2018-07-18 DIAGNOSIS — M5412 Radiculopathy, cervical region: Secondary | ICD-10-CM | POA: Diagnosis not present

## 2018-07-18 DIAGNOSIS — E559 Vitamin D deficiency, unspecified: Secondary | ICD-10-CM | POA: Diagnosis not present

## 2018-07-18 DIAGNOSIS — M2550 Pain in unspecified joint: Secondary | ICD-10-CM | POA: Diagnosis not present

## 2018-07-18 NOTE — Progress Notes (Signed)
Cardiology Office Note   Date:  07/20/2018   ID:  Daryl Levine, DOB [birth year], MRN [id number]  PCP:  Juluis Rainier, MD  Cardiologist:   No primary care provider on file. Referring:  Jarrett Soho, PA-C  Chief Complaint  Patient presents with  . Palpitations  . Chest Pain      History of Present Illness: Daryl Levine is a 68 y.o. male who is referred by Jarrett Soho, PA-C for evaluation of chest pain and palpitations.  He was in the ED in late Jan for this.  I reviewed these records for this visit.    There was no evidence of ischemia.  He reports that 2 months ago he started having palpitations.  Feel like his heart skips a little bit and he has to take a deep breath.  Is very sporadic.  He cannot make it come on.  He feels it may raise for several beats in a row.  He might have isolated skipped beats.  He is not overly active being limited somewhat by knee pain.  He does walk occasionally for exercise and is not sure that this comes on with that.  He does have chest discomfort.  It is difficult for him to quantify or qualify this.  This also seems to be sporadic.  He does not describe substernal discomfort, neck or arm discomfort.  He does not describe new shortness of breath, PND or orthopnea.  He has had no weight gain or edema.  He has had problems with joints and is seen neurology and orthopedics.  He has had some shortness of breath and has been evaluated by pulmonary and I reviewed these records.  CT of his sinuses suggested some sinusitis.  Chest x-ray suggested some bronchitis.  He was treated with bronchodilators.     Past Medical History:  Diagnosis Date  . Allergic rhinitis 06/29/2013  . BPH (benign prostatic hyperplasia)   . BPV (benign positional vertigo) 03/09/2018  . Chronic cough c/w upper airway cough syndrome from chronic sinusitis 04/24/2012   Onset 2011  PFTs 04/2014: no obstruction - Allergy profile 02/08/2018 >  Eos 0.2 /  IgE  9 RAST neg  - Sinus CT  02/20/2018  Right maxillary sinusitis.Leftward septal deviation. > rx Augmentin 875 bid x 20 days > resolved as of 03/08/2018   . DOE (dyspnea on exertion) 04/23/2014   PFTs 04/2014:  No obstruction, no restriction, DLCO inaccurate due to poor technique.  - 02/08/2018  Walked RA x 3 laps @ 185 ft each stopped due to  End of study, nl pace, no sob or desat    - Spirometry 02/08/2018  FEV1 3.0 (106%)  Ratio 80 s curvature off all rx    . Elevated blood pressure 04/24/2012   Associated with 11 lb weight gain.    . Family history of colon cancer 04/24/2012   He plans to schedule colonoscopy next office visit   . Hyperlipidemia   . Obesity 04/27/2011   They try to eat only organic food.      Past Surgical History:  Procedure Laterality Date  . Arthroscopic surgery     R knee  . CYSTOSCOPY     prostate procedure?  . TRIGGER FINGER RELEASE     L thumb     Current Outpatient Medications  Medication Sig Dispense Refill  . aspirin EC 81 MG tablet Take 81 mg by mouth daily.    . fluticasone (FLONASE) 50 MCG/ACT nasal spray Place 2 sprays  into both nostrils daily. (Patient taking differently: Place 2 sprays into both nostrils daily as needed. ) 16 g 1  . gabapentin (NEURONTIN) 300 MG capsule Take 300 mg by mouth 3 (three) times daily.    Marland Kitchen. amLODipine (NORVASC) 2.5 MG tablet Take 1 tablet (2.5 mg total) by mouth daily. 90 tablet 3   No current facility-administered medications for this visit.     Allergies:   Patient has no known allergies.    Social History:  The patient  reports that he has never smoked. He has never used smokeless tobacco. He reports that he does not drink alcohol or use drugs.   Family History:  The patient's family history includes Cancer (age of onset: 2970) in his father; Hypertension in his brother.    ROS:  Please see the history of present illness.   Otherwise, review of systems are positive for none.   All other systems are reviewed and negative.    PHYSICAL  EXAM: VS:  BP (!) 162/100 (BP Location: Left Arm)   Pulse 72   Ht 5\' 5"  (1.651 m)   Wt 189 lb (85.7 kg)   SpO2 98%   BMI 31.45 kg/m  , BMI Body mass index is 31.45 kg/m. GENERAL:  Well appearing HEENT:  Pupils equal round and reactive, fundi not visualized, oral mucosa unremarkable NECK:  No jugular venous distention, waveform within normal limits, carotid upstroke brisk and symmetric, no bruits, no thyromegaly LYMPHATICS:  No cervical, inguinal adenopathy LUNGS:  Clear to auscultation bilaterally BACK:  No CVA tenderness CHEST:  Unremarkable HEART:  PMI not displaced or sustained,S1 and S2 within normal limits, no S3, no S4, no clicks, no rubs, no  murmurs ABD:  Flat, positive bowel sounds normal in frequency in pitch, no bruits, no rebound, no guarding, no midline pulsatile mass, no hepatomegaly, no splenomegaly EXT:  2 plus pulses throughout, no edema, no cyanosis no clubbing SKIN:  No rashes no nodules NEURO:  Cranial nerves II through XII grossly intact, motor grossly intact throughout PSYCH:  Cognitively intact, oriented to person place and time    EKG:  EKG is ordered today. The ekg ordered today demonstrates sinus rhythm, rate 72, axis within normal limits, intervals within normal limits, poor anterior R wave progression   Recent Labs: 06/29/2018: BUN 9; Creatinine, Ser 1.01; Hemoglobin 14.6; Platelets 260; Potassium 3.8; Sodium 134    Lipid Panel    Component Value Date/Time   CHOL 263 (H) 05/04/2011 0828   TRIG 110 05/04/2011 0828   HDL 59 05/04/2011 0828   CHOLHDL 4.5 05/04/2011 0828   VLDL 22 05/04/2011 0828   LDLCALC 182 (H) 05/04/2011 0828   LDLDIRECT 166 (H) 09/28/2012 1556      Wt Readings from Last 3 Encounters:  07/20/18 189 lb (85.7 kg)  06/29/18 190 lb (86.2 kg)  03/08/18 199 lb 9.6 oz (90.5 kg)      Other studies Reviewed: Additional studies/ records that were reviewed today include: ED records and primary care labs and office note. Review of  the above records demonstrates:  Please see elsewhere in the note.     ASSESSMENT AND PLAN:  PALPITATIONS:   Patient's palpitations are vague.  I do not think an event recorder will be particularly helpful.  I could consider beta-blocker or Cardizem for management of his blood pressure and I would consider this if he has any increasing symptoms in the future.  The patient is wife and I discussed this.  CHEST PAIN:  He has very vague chest discomfort.  His brother died suddenly at age 33 and there is no clear etiology.  He does have risk factors as described.  I am to screen him with a POET (Plain Old Exercise Treadmill)  HTN: His blood pressures not well controlled.  He will start Norvasc 2.5 mg daily.  ELEVATED BS: His A1c was slightly elevated at 6.4.  However, he has since changed his diet and is going to have this followed by his primary care with repeat testing.  We talked about exercise and diet for management of this.  DYSLIPIDEMIA: He wants to try to control this with diet.  His LDL is 61 with an HDL of 60.  I have a low threshold for a statin and could consider calcium scoring although this might be difficult for him to pay for.  This however might guide target therapy in the future with calculation of a MESA score if diet has not significantly changed his lipids.  Current medicines are reviewed at length with the patient today.  The patient does not have concerns regarding medicines.  The following changes have been made:  no change  Labs/ tests ordered today include:   Orders Placed This Encounter  Procedures  . EXERCISE TOLERANCE TEST (ETT)  . EKG 12-Lead     Disposition:   FU with me in 6 months.     Signed, Rollene Rotunda, MD  07/20/2018 2:41 PM    [facility]

## 2018-07-20 ENCOUNTER — Encounter: Payer: Self-pay | Admitting: Cardiology

## 2018-07-20 ENCOUNTER — Ambulatory Visit: Payer: Medicare Other | Admitting: Cardiology

## 2018-07-20 VITALS — BP 162/100 | HR 72 | Ht 65.0 in | Wt 189.0 lb

## 2018-07-20 DIAGNOSIS — R079 Chest pain, unspecified: Secondary | ICD-10-CM | POA: Diagnosis not present

## 2018-07-20 DIAGNOSIS — R739 Hyperglycemia, unspecified: Secondary | ICD-10-CM | POA: Insufficient documentation

## 2018-07-20 DIAGNOSIS — R000 Tachycardia, unspecified: Secondary | ICD-10-CM | POA: Insufficient documentation

## 2018-07-20 DIAGNOSIS — E785 Hyperlipidemia, unspecified: Secondary | ICD-10-CM

## 2018-07-20 DIAGNOSIS — I10 Essential (primary) hypertension: Secondary | ICD-10-CM | POA: Insufficient documentation

## 2018-07-20 MED ORDER — AMLODIPINE BESYLATE 2.5 MG PO TABS: 2.5000 mg | ORAL_TABLET | Freq: Every day | ORAL | 3 refills | Status: DC

## 2018-07-20 NOTE — Patient Instructions (Addendum)
Medication Instructions:  START- Amlodipine 2.5 mg daily  If you need a refill on your cardiac medications before your next appointment, please call your pharmacy.  Labwork: None Ordered   Testing/Procedures: Your physician has requested that you have an exercise tolerance test. For further information please visit [email or web address] Please also follow instruction sheet, as given.   Follow-Up: Your physician recommends that you schedule a follow-up appointment in: 3 Months  At [facility], you and your health needs are our priority.  As part of our continuing mission to provide you with exceptional heart care, we have created designated Provider Care Teams.  These Care Teams include your primary Cardiologist (physician) and Advanced Practice Providers (APPs -  Physician Assistants and Nurse Practitioners) who all work together to provide you with the care you need, when you need it.  Thank you for choosing [facility] at [facility]!!

## 2018-07-25 ENCOUNTER — Telehealth: Payer: Self-pay | Admitting: *Deleted

## 2018-07-25 ENCOUNTER — Telehealth (HOSPITAL_COMMUNITY): Payer: Self-pay

## 2018-07-25 NOTE — Telephone Encounter (Signed)
Encounter complete. 

## 2018-07-25 NOTE — Telephone Encounter (Signed)
Spoke with patient regarding starting Amlodipine. He read side effects and is apprehensive about starting. Discussed with patient and advised if any issues after starting to call back.

## 2018-07-27 ENCOUNTER — Inpatient Hospital Stay (HOSPITAL_COMMUNITY): Admission: RE | Admit: 2018-07-27 | Payer: Medicare Other | Source: Ambulatory Visit

## 2018-08-29 DIAGNOSIS — M5417 Radiculopathy, lumbosacral region: Secondary | ICD-10-CM | POA: Diagnosis not present

## 2018-08-29 DIAGNOSIS — R51 Headache: Secondary | ICD-10-CM | POA: Diagnosis not present

## 2018-08-29 DIAGNOSIS — G5603 Carpal tunnel syndrome, bilateral upper limbs: Secondary | ICD-10-CM | POA: Diagnosis not present

## 2018-08-29 DIAGNOSIS — M542 Cervicalgia: Secondary | ICD-10-CM | POA: Diagnosis not present

## 2018-08-30 ENCOUNTER — Encounter (HOSPITAL_COMMUNITY): Payer: Medicare Other

## 2018-09-28 ENCOUNTER — Encounter (HOSPITAL_COMMUNITY): Payer: Medicare Other

## 2018-10-26 ENCOUNTER — Telehealth: Payer: Medicare Other | Admitting: Cardiology

## 2018-11-02 DIAGNOSIS — R51 Headache: Secondary | ICD-10-CM | POA: Diagnosis not present

## 2018-11-02 DIAGNOSIS — M5417 Radiculopathy, lumbosacral region: Secondary | ICD-10-CM | POA: Diagnosis not present

## 2018-11-02 DIAGNOSIS — M542 Cervicalgia: Secondary | ICD-10-CM | POA: Diagnosis not present

## 2018-11-02 DIAGNOSIS — M5481 Occipital neuralgia: Secondary | ICD-10-CM | POA: Diagnosis not present

## 2018-11-28 ENCOUNTER — Telehealth: Payer: Self-pay | Admitting: *Deleted

## 2018-11-28 NOTE — Telephone Encounter (Signed)
-----   Message from Vennie Homans sent at 11/28/2018  9:53 AM EDT ----- Regarding: FW: ORDER Covid testing for GXT patient  ----- Message ----- From: Caprice Kluver Sent: 11/28/2018   9:27 AM EDT To: Kela Millin Subject: ORDER Covid testing for GXT patient            Good morning [name]!  Hope you are doing well.  Patient Daryl Levine (pt of [facility]och[facility]) is on our [facility] schedule for a GXT on 12-05-18.  He will need Covid testing before that GXT can be performed.  Can you please call the patient and order/schedule him for the Covid testing for this Thursday 11-30-18.  He would need to be instructed to self isolate after the covid testing until the GXT appointment on 12-05-18.  He may not want to proceed with the appointment on 7-7 due to having to isolate over the holiday weekend.  Thanks, Shirlean Mylar

## 2018-11-28 NOTE — Telephone Encounter (Signed)
Left message to call back  

## 2018-11-29 NOTE — Telephone Encounter (Signed)
We have not confirmed that we will be doing COVID testing before POET (Plain Old Exercise Treadmill).   Again, we are working on this protocol.  Hold off on scheduling until Dr. Meda Coffee gets with [place].

## 2018-11-29 NOTE — Telephone Encounter (Addendum)
Spoke with patient and he does not feel comfortable with getting COVID testing prior to GXT.  Per patient he has had no COVID  Tried to explain to patient that with the COVID 19 pandemic this is felt to be necessary/protocol to keep all involved risks to a minimum. Patient still declines at this time. Advised to call back if anything changes  Patient does continue to feel his heart "palpitate" but not really pain, stated hard to describe. He does go for walks with his wife, denies pain at this time and feels palpitations better with walking. He remains concerned about heart.  Will forward to Dr Percival Spanish for review

## 2018-12-05 ENCOUNTER — Inpatient Hospital Stay (HOSPITAL_COMMUNITY): Admission: RE | Admit: 2018-12-05 | Payer: Medicare Other | Source: Ambulatory Visit

## 2018-12-11 ENCOUNTER — Encounter (HOSPITAL_COMMUNITY): Payer: Self-pay

## 2018-12-15 NOTE — Telephone Encounter (Signed)
Spoke with patient and he still does not want to proceed with GXT given required COVID testing

## 2018-12-18 NOTE — Telephone Encounter (Signed)
If he is still having chest pain he might want to schedule a follow up visit to consider other testing.

## 2018-12-28 NOTE — Telephone Encounter (Signed)
Spoke with patient and he states chest pain has improved. Advised if any changes to call the office, verbalized understanding

## 2019-01-23 ENCOUNTER — Ambulatory Visit: Payer: Medicare Other | Admitting: Cardiology

## 2019-04-05 DIAGNOSIS — M5412 Radiculopathy, cervical region: Secondary | ICD-10-CM | POA: Diagnosis not present

## 2019-04-05 DIAGNOSIS — R519 Headache, unspecified: Secondary | ICD-10-CM | POA: Diagnosis not present

## 2019-04-05 DIAGNOSIS — G5603 Carpal tunnel syndrome, bilateral upper limbs: Secondary | ICD-10-CM | POA: Diagnosis not present

## 2019-04-05 DIAGNOSIS — M5417 Radiculopathy, lumbosacral region: Secondary | ICD-10-CM | POA: Diagnosis not present

## 2019-04-13 DIAGNOSIS — M47812 Spondylosis without myelopathy or radiculopathy, cervical region: Secondary | ICD-10-CM | POA: Diagnosis not present

## 2019-04-13 DIAGNOSIS — M5137 Other intervertebral disc degeneration, lumbosacral region: Secondary | ICD-10-CM | POA: Diagnosis not present

## 2019-04-13 DIAGNOSIS — M25512 Pain in left shoulder: Secondary | ICD-10-CM | POA: Diagnosis not present

## 2019-04-13 DIAGNOSIS — M47816 Spondylosis without myelopathy or radiculopathy, lumbar region: Secondary | ICD-10-CM | POA: Diagnosis not present

## 2019-04-13 DIAGNOSIS — M5126 Other intervertebral disc displacement, lumbar region: Secondary | ICD-10-CM | POA: Diagnosis not present

## 2019-04-13 DIAGNOSIS — M5125 Other intervertebral disc displacement, thoracolumbar region: Secondary | ICD-10-CM | POA: Diagnosis not present

## 2019-04-13 DIAGNOSIS — R600 Localized edema: Secondary | ICD-10-CM | POA: Diagnosis not present

## 2019-05-23 NOTE — Telephone Encounter (Signed)
completed

## 2019-09-18 DIAGNOSIS — R3915 Urgency of urination: Secondary | ICD-10-CM | POA: Diagnosis not present

## 2019-09-18 DIAGNOSIS — Z125 Encounter for screening for malignant neoplasm of prostate: Secondary | ICD-10-CM | POA: Diagnosis not present

## 2019-09-18 DIAGNOSIS — R350 Frequency of micturition: Secondary | ICD-10-CM | POA: Diagnosis not present

## 2019-09-18 DIAGNOSIS — N410 Acute prostatitis: Secondary | ICD-10-CM | POA: Diagnosis not present

## 2019-11-27 DIAGNOSIS — R972 Elevated prostate specific antigen [PSA]: Secondary | ICD-10-CM | POA: Diagnosis not present

## 2019-12-18 DIAGNOSIS — Z0000 Encounter for general adult medical examination without abnormal findings: Secondary | ICD-10-CM | POA: Diagnosis not present

## 2019-12-18 DIAGNOSIS — I10 Essential (primary) hypertension: Secondary | ICD-10-CM | POA: Diagnosis not present

## 2019-12-18 DIAGNOSIS — Z1159 Encounter for screening for other viral diseases: Secondary | ICD-10-CM | POA: Diagnosis not present

## 2019-12-18 DIAGNOSIS — N400 Enlarged prostate without lower urinary tract symptoms: Secondary | ICD-10-CM | POA: Diagnosis not present

## 2019-12-18 DIAGNOSIS — E785 Hyperlipidemia, unspecified: Secondary | ICD-10-CM | POA: Diagnosis not present

## 2020-03-04 ENCOUNTER — Other Ambulatory Visit: Payer: Medicare Other

## 2020-03-04 DIAGNOSIS — Z20822 Contact with and (suspected) exposure to covid-19: Secondary | ICD-10-CM | POA: Diagnosis not present

## 2020-03-04 DIAGNOSIS — R059 Cough, unspecified: Secondary | ICD-10-CM | POA: Diagnosis not present

## 2020-03-06 LAB — NOVEL CORONAVIRUS, NAA: SARS-CoV-2, NAA: NOT DETECTED

## 2020-03-06 LAB — SARS-COV-2, NAA 2 DAY TAT

## 2020-05-05 DIAGNOSIS — H5203 Hypermetropia, bilateral: Secondary | ICD-10-CM | POA: Diagnosis not present

## 2020-06-05 DIAGNOSIS — R030 Elevated blood-pressure reading, without diagnosis of hypertension: Secondary | ICD-10-CM | POA: Diagnosis not present

## 2020-06-05 DIAGNOSIS — R350 Frequency of micturition: Secondary | ICD-10-CM | POA: Diagnosis not present

## 2020-06-05 DIAGNOSIS — N3289 Other specified disorders of bladder: Secondary | ICD-10-CM | POA: Diagnosis not present

## 2020-06-12 DIAGNOSIS — R102 Pelvic and perineal pain: Secondary | ICD-10-CM | POA: Diagnosis not present

## 2020-06-12 DIAGNOSIS — R109 Unspecified abdominal pain: Secondary | ICD-10-CM | POA: Diagnosis not present

## 2020-06-13 ENCOUNTER — Other Ambulatory Visit: Payer: Self-pay

## 2020-06-13 ENCOUNTER — Other Ambulatory Visit (HOSPITAL_COMMUNITY): Payer: Self-pay | Admitting: Family Medicine

## 2020-06-13 ENCOUNTER — Ambulatory Visit (HOSPITAL_COMMUNITY)
Admission: RE | Admit: 2020-06-13 | Discharge: 2020-06-13 | Disposition: A | Discharge status: Home / Self Care | Payer: Medicare Other | Source: Ambulatory Visit | Attending: Family Medicine | Admitting: Family Medicine

## 2020-06-13 DIAGNOSIS — R918 Other nonspecific abnormal finding of lung field: Secondary | ICD-10-CM | POA: Insufficient documentation

## 2020-06-13 DIAGNOSIS — R102 Pelvic and perineal pain: Secondary | ICD-10-CM | POA: Diagnosis not present

## 2020-06-13 LAB — POCT I-STAT CREATININE: Creatinine, Ser: 1.1 mg/dL (ref 0.61–1.24)

## 2020-06-13 MED ORDER — IOHEXOL 300 MG/ML  SOLN
100.0000 mL | Freq: Once | INTRAMUSCULAR | Status: AC | PRN
  Administered 2020-06-13: 100 mL via INTRAVENOUS

## 2020-06-13 MED ORDER — IOHEXOL 9 MG/ML PO SOLN
ORAL | Status: AC
  Filled 2020-06-13: qty 1000

## 2020-06-13 MED ORDER — IOHEXOL 9 MG/ML PO SOLN
1000.0000 mL | ORAL | Status: AC
  Administered 2020-06-13: 1000 mL via ORAL

## 2020-06-23 DIAGNOSIS — J329 Chronic sinusitis, unspecified: Secondary | ICD-10-CM | POA: Diagnosis not present

## 2020-06-23 DIAGNOSIS — Z1211 Encounter for screening for malignant neoplasm of colon: Secondary | ICD-10-CM | POA: Diagnosis not present

## 2020-06-23 DIAGNOSIS — K5900 Constipation, unspecified: Secondary | ICD-10-CM | POA: Diagnosis not present

## 2020-07-01 DIAGNOSIS — Z1211 Encounter for screening for malignant neoplasm of colon: Secondary | ICD-10-CM | POA: Diagnosis not present

## 2020-07-01 DIAGNOSIS — K5730 Diverticulosis of large intestine without perforation or abscess without bleeding: Secondary | ICD-10-CM | POA: Diagnosis not present

## 2020-07-01 DIAGNOSIS — K635 Polyp of colon: Secondary | ICD-10-CM | POA: Diagnosis not present

## 2020-07-01 DIAGNOSIS — D123 Benign neoplasm of transverse colon: Secondary | ICD-10-CM | POA: Diagnosis not present

## 2020-07-09 DIAGNOSIS — E785 Hyperlipidemia, unspecified: Secondary | ICD-10-CM | POA: Diagnosis not present

## 2020-07-09 DIAGNOSIS — R739 Hyperglycemia, unspecified: Secondary | ICD-10-CM | POA: Diagnosis not present

## 2021-03-02 IMAGING — CT CT ABD-PELV W/ CM
2 of 5 series · 15 of 46 positions shown, 17 images · IV contrast (APPLIED)
Comparison: Chest radiographs 06/29/2018. No other comparison
studies.

CLINICAL DATA: Chronic low pelvic pain for 2-3 months.

EXAM:
CT ABDOMEN AND PELVIS WITH CONTRAST
TECHNIQUE: Multidetector CT imaging of the abdomen and pelvis was performed
using the standard protocol following bolus administration of
intravenous contrast.
CONTRAST:  100mL OMNIPAQUE IOHEXOL 300 MG/ML  SOLN

[Series 2: axial st · axial · 0.81mm/px · z∈[-524,-134]mm · 12 of 92 slices shown, 14 images]
[im 7/92  soft-tissue]
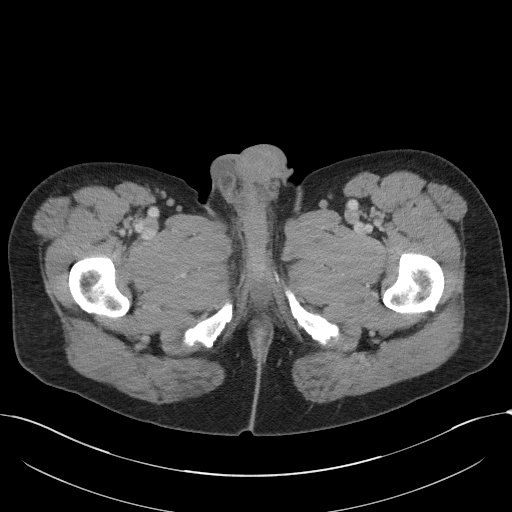
[im 7/92  bone]
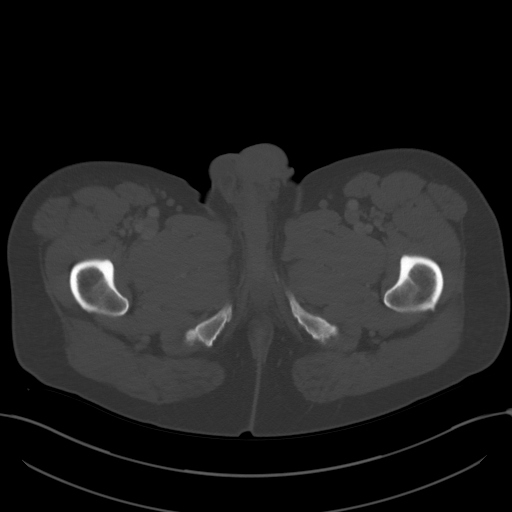
[im 13/92  soft-tissue]
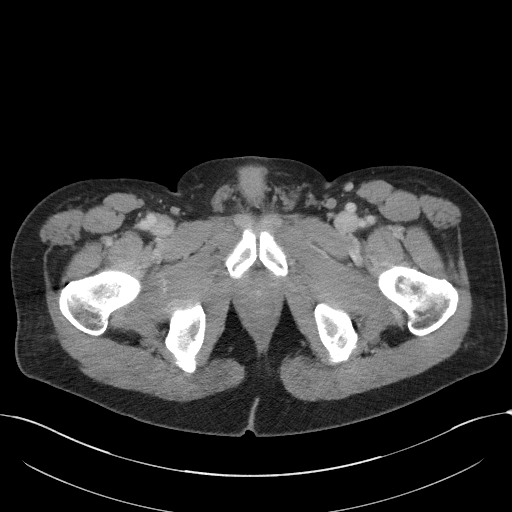
[im 19/92  soft-tissue]
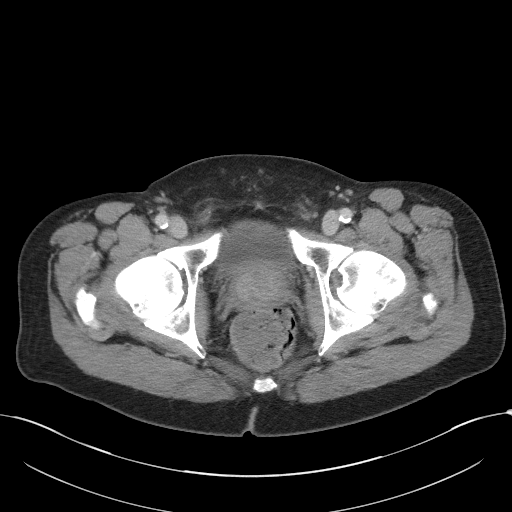
[im 31/92  soft-tissue]
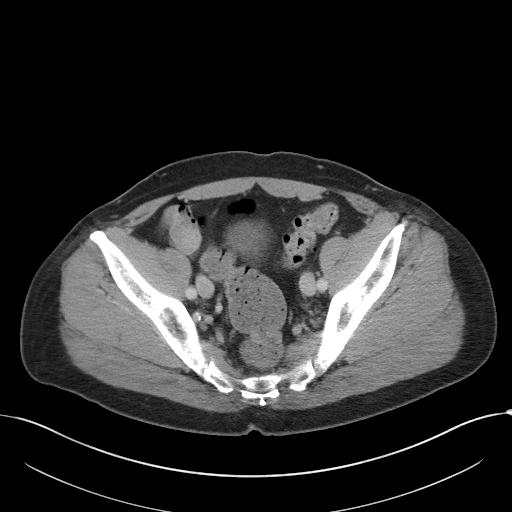
[im 37/92  soft-tissue]
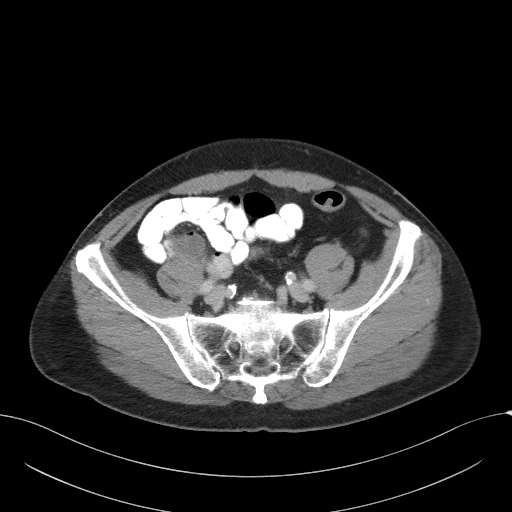
[im 43/92  soft-tissue]
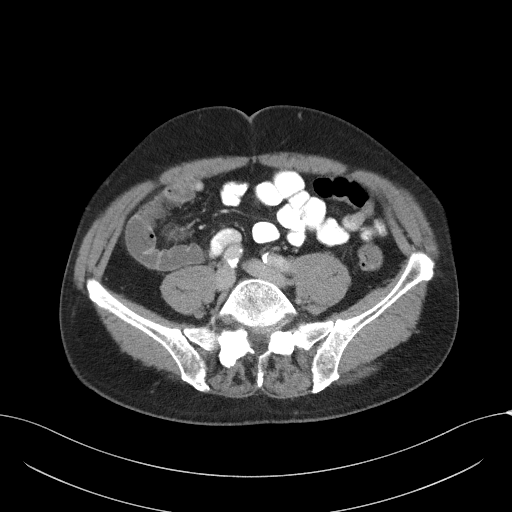
[im 49/92  soft-tissue]
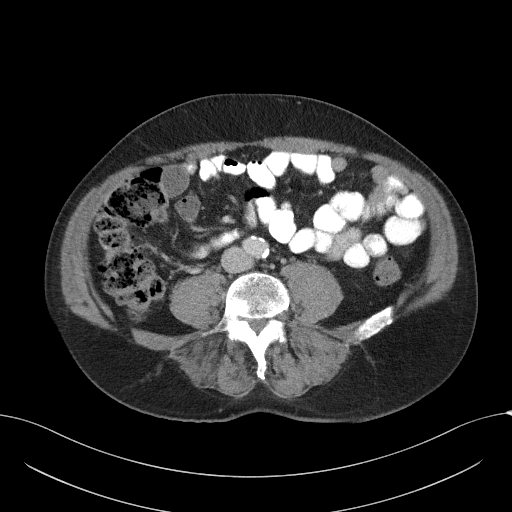
[im 55/92  soft-tissue]
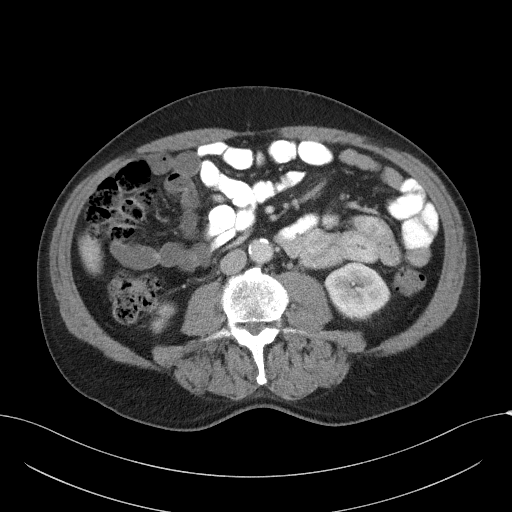
[im 61/92  soft-tissue]
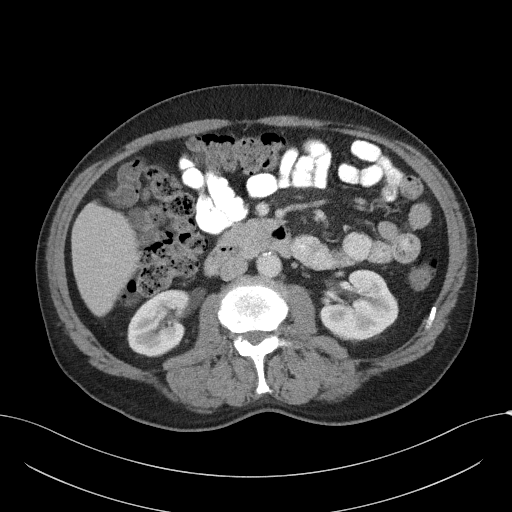
[im 61/92  bone]
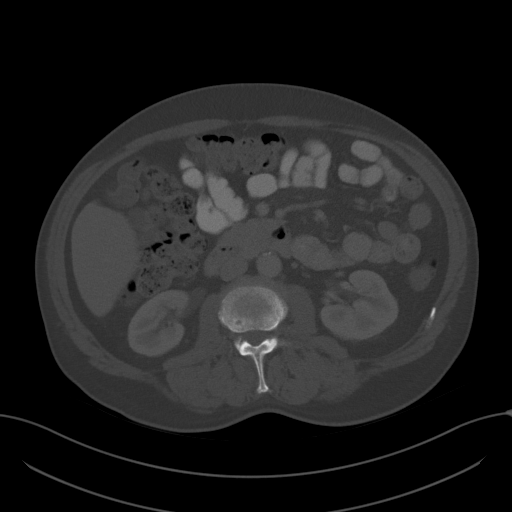
[im 73/92  soft-tissue]
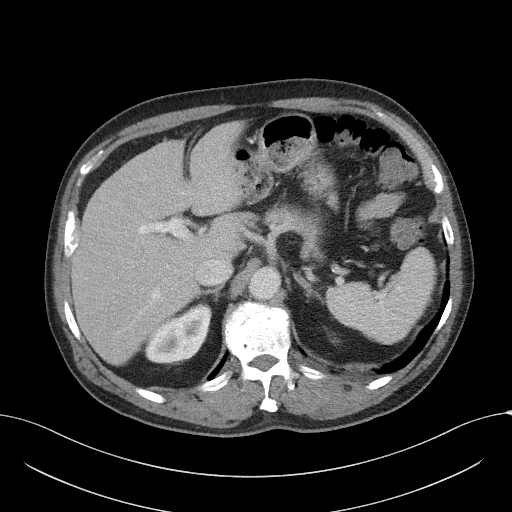
[im 79/92  soft-tissue]
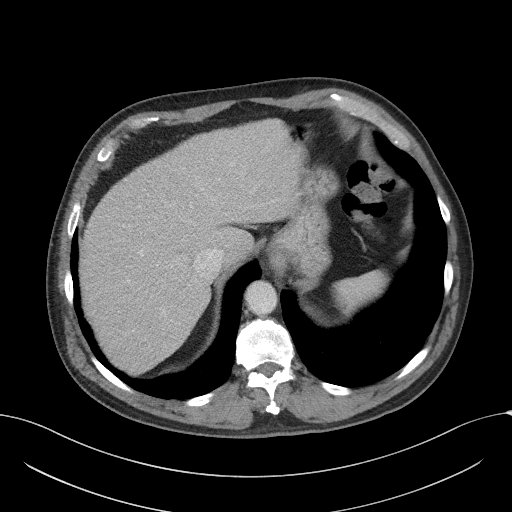
[im 85/92  soft-tissue]
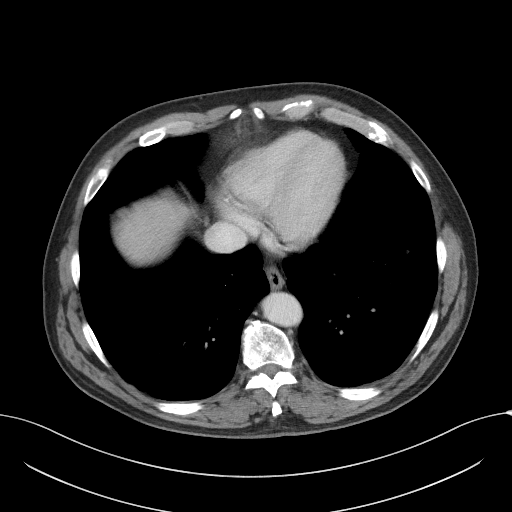

[Series 5: coronal st · coronal · 0.76mm/px · 3 of 101 slices shown]
[im 34/101  soft-tissue]
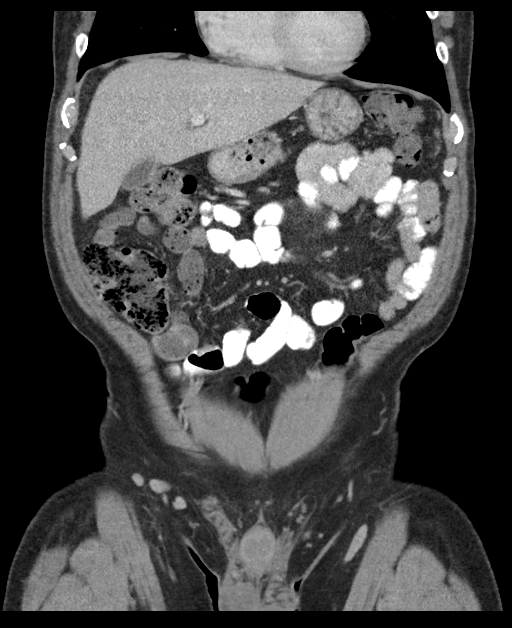
[im 45/101  soft-tissue]
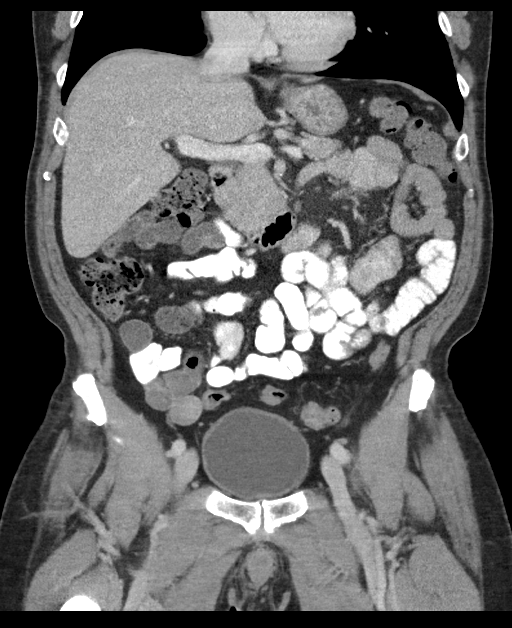
[im 56/101  soft-tissue]
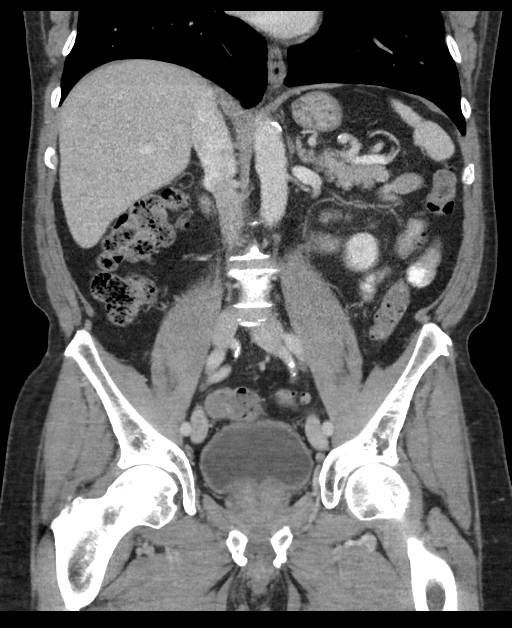

[15 of 46 positions shown; findings below may reference images not displayed]

FINDINGS: Lower chest: There are 3 nodular ground-glass opacities at the left
lung base, measuring 3.2 x 1.7 cm on image [DATE], 1.9 cm on image [DATE]
and 1.7 cm on image [DATE]. The visualized right lung base is clear.
There is no pleural effusion or pneumothorax.

Hepatobiliary: The liver is normal in density without suspicious
focal abnormality. No evidence of gallstones, gallbladder wall
thickening or biliary dilatation.

Pancreas: Unremarkable. No pancreatic ductal dilatation or
surrounding inflammatory changes.

Spleen: Normal in size without focal abnormality.

Adrenals/Urinary Tract: Both adrenal glands appear normal. There is
a 2.9 cm cyst in the upper pole of the right kidney. There are right
renovascular calcifications without significant aneurysm. The left
kidney appears normal. No evidence of urinary tract calculus or
hydronephrosis. Mild diffuse bladder wall thickening without focal
abnormality.

Stomach/Bowel: The stomach appears unremarkable for its degree of
distension. No evidence of bowel wall thickening, distention or
surrounding inflammatory change. The appendix is not clearly seen,
but there is no evidence of pericecal inflammation. A moderate to
large amount of stool is present throughout the colon.

Vascular/Lymphatic: There are no enlarged abdominal or pelvic lymph
nodes. Aortic and branch vessel atherosclerosis without aneurysm or
large vessel occlusion. The portal, superior mesenteric and splenic
veins are patent.

Reproductive: Mild enlargement and central nodularity of the
prostate gland, likely reflecting BPH.

Other: No evidence of abdominal wall mass or hernia. No ascites.

Musculoskeletal: No acute or significant osseous findings. Mild
lumbar spondylosis.
IMPRESSION: 1. No acute findings or explanation for the patient's symptoms in
the abdomen or pelvis.
2. Incompletely visualized nodular ground-glass opacities at the
left lung base, likely inflammatory/infectious. This appearance can
be seen with COVID 19 pneumonia; correlate clinically and consider
chest radiographs.
3. Moderate to large amount of stool throughout the colon suggesting
constipation.
4. Aortic Atherosclerosis (AR2HQ-I50.0).
5. These results will be called to the ordering clinician or
representative by the Radiologist Assistant, and communication
documented in the PACS or [REDACTED].

## 2021-04-11 ENCOUNTER — Encounter (HOSPITAL_COMMUNITY): Payer: Self-pay

## 2021-04-11 ENCOUNTER — Emergency Department (HOSPITAL_COMMUNITY): Payer: Medicare Other

## 2021-04-11 ENCOUNTER — Other Ambulatory Visit: Payer: Self-pay

## 2021-04-11 ENCOUNTER — Emergency Department (HOSPITAL_COMMUNITY)
Admission: EM | Admit: 2021-04-11 | Discharge: 2021-04-11 | Disposition: A | Discharge status: Home / Self Care | Payer: Medicare Other | Attending: Emergency Medicine | Admitting: Emergency Medicine

## 2021-04-11 DIAGNOSIS — R918 Other nonspecific abnormal finding of lung field: Secondary | ICD-10-CM | POA: Diagnosis not present

## 2021-04-11 DIAGNOSIS — R0789 Other chest pain: Secondary | ICD-10-CM | POA: Diagnosis not present

## 2021-04-11 DIAGNOSIS — M25512 Pain in left shoulder: Secondary | ICD-10-CM | POA: Insufficient documentation

## 2021-04-11 DIAGNOSIS — J90 Pleural effusion, not elsewhere classified: Secondary | ICD-10-CM | POA: Diagnosis not present

## 2021-04-11 DIAGNOSIS — R0602 Shortness of breath: Secondary | ICD-10-CM | POA: Diagnosis not present

## 2021-04-11 DIAGNOSIS — J189 Pneumonia, unspecified organism: Secondary | ICD-10-CM | POA: Diagnosis not present

## 2021-04-11 DIAGNOSIS — Z79899 Other long term (current) drug therapy: Secondary | ICD-10-CM | POA: Insufficient documentation

## 2021-04-11 DIAGNOSIS — I10 Essential (primary) hypertension: Secondary | ICD-10-CM | POA: Insufficient documentation

## 2021-04-11 DIAGNOSIS — Z7982 Long term (current) use of aspirin: Secondary | ICD-10-CM | POA: Insufficient documentation

## 2021-04-11 DIAGNOSIS — M25511 Pain in right shoulder: Secondary | ICD-10-CM | POA: Diagnosis not present

## 2021-04-11 DIAGNOSIS — Z20822 Contact with and (suspected) exposure to covid-19: Secondary | ICD-10-CM | POA: Diagnosis not present

## 2021-04-11 LAB — BASIC METABOLIC PANEL
Anion gap: 9 (ref 5–15)
BUN: 12 mg/dL (ref 8–23)
CO2: 22 mmol/L (ref 22–32)
Calcium: 8.9 mg/dL (ref 8.9–10.3)
Chloride: 102 mmol/L (ref 98–111)
Creatinine, Ser: 0.94 mg/dL (ref 0.61–1.24)
GFR, Estimated: >60 mL/min (ref >60)
Glucose, Bld: 118 mg/dL — ABNORMAL HIGH (ref 70–99)
Potassium: 3.9 mmol/L (ref 3.5–5.1)
Sodium: 133 mmol/L — ABNORMAL LOW (ref 135–145)

## 2021-04-11 LAB — CBC
HCT: 40.6 % (ref 39.0–52.0)
Hemoglobin: 14 g/dL (ref 13.0–17.0)
MCH: 29.7 pg (ref 26.0–34.0)
MCHC: 34.5 g/dL (ref 30.0–36.0)
MCV: 86 fL (ref 80.0–100.0)
Platelets: 269 10*3/uL (ref 150–400)
RBC: 4.72 MIL/uL (ref 4.22–5.81)
RDW: 13.4 % (ref 11.5–15.5)
WBC: 7 10*3/uL (ref 4.0–10.5)
nRBC: 0 % (ref 0.0–0.2)

## 2021-04-11 LAB — RESP PANEL BY RT-PCR (FLU A&B, COVID) ARPGX2
Influenza A by PCR: NEGATIVE
Influenza B by PCR: NEGATIVE
SARS Coronavirus 2 by RT PCR: NEGATIVE

## 2021-04-11 LAB — TROPONIN I (HIGH SENSITIVITY)
Troponin I (High Sensitivity): 4 ng/L (ref <18)
Troponin I (High Sensitivity): 5 ng/L (ref <18)

## 2021-04-11 MED ORDER — IOHEXOL 350 MG/ML SOLN
80.0000 mL | Freq: Once | INTRAVENOUS | Status: AC | PRN
  Administered 2021-04-11: 80 mL via INTRAVENOUS

## 2021-04-11 MED ORDER — NITROGLYCERIN 0.4 MG SL SUBL
0.4000 mg | SUBLINGUAL_TABLET | SUBLINGUAL | Status: DC | PRN
  Administered 2021-04-11: 0.4 mg via SUBLINGUAL
  Filled 2021-04-11: qty 1

## 2021-04-11 NOTE — ED Triage Notes (Signed)
Pt c/o chest pain and sob since last night.

## 2021-04-11 NOTE — ED Provider Notes (Addendum)
  Physical Exam  BP 117/76   Pulse (!) 58   Temp 98.2 F (36.8 C) (Oral)   Resp 15   Ht 5\' 9"  (1.753 m)   Wt 86.2 kg   SpO2 97%   BMI 28.06 kg/m   Physical Exam Vitals and nursing note reviewed.  Constitutional:      General: He is not in acute distress.    Appearance: Normal appearance.  HENT:     Head: Normocephalic and atraumatic.  Eyes:     General:        Right eye: No discharge.        Left eye: No discharge.  Cardiovascular:     Comments: Regular rate and rhythm.  S1/S2 are distinct without any evidence of murmur, rubs, or gallops.  Radial pulses are 2+ bilaterally.  Dorsalis pedis pulses are 2+ bilaterally.  No evidence of pedal edema. Pulmonary:     Comments: Clear to auscultation bilaterally.  Normal effort.  No respiratory distress.  No evidence of wheezes, rales, or rhonchi heard throughout. Abdominal:     General: Abdomen is flat. Bowel sounds are normal. There is no distension.     Tenderness: There is no abdominal tenderness. There is no guarding or rebound.  Musculoskeletal:        General: Normal range of motion.     Cervical back: Neck supple.  Skin:    General: Skin is warm and dry.     Findings: No rash.  Neurological:     General: No focal deficit present.     Mental Status: He is alert.  Psychiatric:        Mood and Affect: Mood normal.        Behavior: Behavior normal.    ED Course/Procedures     Procedures  MDM  Accepted handoff at shift change from PA-C. Please see prior provider note for more detail.   Briefly: Patient is 70 y.o. male with history of dyslipidemia, hypertension, and chronic cough who presents to the emergency department with shortness of breath and chest pain.  Chest pain is worse with respirations.  No alleviating factors.  Denies any infectious symptoms. No abdominal pain, nausea, vomiting, or diarrhea. No lower extremity edema, sick contacts, or recent traveling.   DDX: concern for ACS, pulmonary embolism,  pneumonia, esophageal spasm, GERD  Plan: Daryl Levine is a 70 y.o. male who presents to the emergency department for further evaluation of shortness of breath and chest pain.  He has never had a formal cardiac work-up.  He has no significant cardiac risk factors.  His CBC and BMP are ultimately unrevealing.  Troponin x2 were both negative making ACS less likely.  COVID and influenza were both negative.  CT angiography did not reveal any pulmonary embolism but does show a left pleural effusion.  Made patient aware. Pain is still having mild substernal chest pressure which is improved from when he arrived. Given the clinical scenario, this is likely gastric in nature.  Instructed him to take Tums as needed with primary care follow-up and possible cardiology referral for formal cardiac work-up. Strict return precautions were given. All questions and concerns addressed.           79, PA-C 04/11/21 1807    13/12/22, PA-C 04/11/21 13/12/22    Merrily Brittle, MD 04/11/21 [phone]

## 2021-04-11 NOTE — ED Provider Notes (Signed)
  Face-to-face evaluation   History: He presents here for evaluation of shortness of breath with chest pain.  On arrival vital signs normal except blood pressure elevated 147/89.  Patient's chest discomfort started during sleep.  He has mild residual anterior chest discomfort.  He states he is never had any cardiac evaluation and does not have ongoing cardiac symptoms.  He occasionally gets heartburn manifested by belching.  He denies current shortness of breath, weakness or dizziness.  He has been eating well recently.  Physical exam: Elderly, alert and cooperative.  No respiratory distress.  No dysarthria or aphasia.  He is lying supine comfortably.  Medical screening examination/treatment/procedure(s) were conducted as a shared visit with non-physician practitioner(s) and myself.  I personally evaluated the patient during the encounter    Mancel Bale, MD 04/11/21 2327

## 2021-04-11 NOTE — Discharge Instructions (Signed)
Your work-up today did not reveal any signs of heart or lung damage.  This is likely coming from your stomach and/or esophagus.  I would like for you to take Tums as needed if you have symptoms.  I would like for you to follow-up with your primary care doctor within the next week for further evaluation with possible cardiology referral for formal heart work-up.  Please return to the emergency room if you experience worsening chest pain, shortness of breath, profuse sweating, intractable nausea/vomiting, or any other concerns you might have.

## 2021-04-11 NOTE — ED Notes (Signed)
ED RN reassessed pt after nitro. Pt sleeping upon ed rn's entrance to room.  Pt denies chest pain, pt states "I feel like it let me rest."

## 2021-04-11 NOTE — ED Provider Notes (Signed)
Emergency Medicine Provider Triage Evaluation Note  Eual Lindstrom , a 70 y.o. male  was evaluated in triage.  Pt complains of chest pain and SOB worsened with breathing since 0000 today. Chest pain radiates to his neck and left and right shoulders. Denies any nausea or diaphoresis. Denies any cardiac problems or health history. Denies any lightheadedness or dizziness.   Review of Systems  Positive: Chest pain, SOB  Negative: Nausea, diaphoresis, lightheadedness, dizziness   Physical Exam  BP (!) 147/89 (BP Location: Left Arm)   Pulse 82   Temp 98.2 F (36.8 C) (Oral)   Resp 16   Ht 5\' 9"  (1.753 m)   Wt 86.2 kg   SpO2 98%   BMI 28.06 kg/m  Gen:   Awake, no distress   Resp:  Normal effort  MSK:   Moves extremities without difficulty  Other:  RRR  Medical Decision Making  Medically screening exam initiated at 10:50 AM.  Appropriate orders placed.  Alben Jepsen was informed that the remainder of the evaluation will be completed by another provider, this initial triage assessment does not replace that evaluation, and the importance of remaining in the ED until their evaluation is complete.  Cardiac workup   Bo Merino, Achille Rich 04/11/21 1055    13/12/22, MD 04/13/21 [phone]

## 2021-04-11 NOTE — ED Provider Notes (Signed)
[facility]-EMERGENCY DEPT Provider Note   CSN: [id number] Arrival date & time: 04/11/21  1025     History Chief Complaint  Patient presents with   Chest Pain   Shortness of Breath    Daryl Levine is a 70 y.o. male.  With past medical history of dyslipidemia, hypertension, chronic cough who presents emergency department with shortness of breath and chest pain.  He states around midnight this morning he was sitting down and started having bilateral shoulder pain.  He states that he then began feeling short of breath.  And he states that the pain in his shoulders then began to move to his anterior chest.  He states that the pain was worse with breathing.  He states that he tried to lay down but could not due to his difficulty breathing.  He took ibuprofen without relief of symptoms.  No other alleviating factors.  He denies coughing, sore throat, fever, lower extremity edema, palpitations, known sick contacts, recent traveling.  Denies abdominal pain, nausea, vomiting or diarrhea.   Chest Pain Associated symptoms: shortness of breath   Associated symptoms: no abdominal pain, no cough, no diaphoresis, no fever, no nausea, no palpitations and no vomiting   Shortness of Breath Associated symptoms: chest pain   Associated symptoms: no abdominal pain, no cough, no diaphoresis, no fever, no sore throat and no vomiting       Past Medical History:  Diagnosis Date   Allergic rhinitis 06/29/2013   BPH (benign prostatic hyperplasia)    BPV (benign positional vertigo) 03/09/2018   Chronic cough c/w upper airway cough syndrome from chronic sinusitis 04/24/2012   Onset 2011  PFTs 04/2014: no obstruction - Allergy profile 02/08/2018 >  Eos 0.2 /  IgE  9 RAST neg  - Sinus CT 02/20/2018  Right maxillary sinusitis.Leftward septal deviation. > rx Augmentin 875 bid x 20 days > resolved as of 03/08/2018    DOE (dyspnea on exertion) 04/23/2014   PFTs 04/2014:  No obstruction, no  restriction, DLCO inaccurate due to poor technique.  - 02/08/2018  Walked RA x 3 laps @ 185 ft each stopped due to  End of study, nl pace, no sob or desat    - Spirometry 02/08/2018  FEV1 3.0 (106%)  Ratio 80 s curvature off all rx     Elevated blood pressure 04/24/2012   Associated with 11 lb weight gain.     Family history of colon cancer 04/24/2012   He plans to schedule colonoscopy next office visit    Hyperlipidemia    Obesity 04/27/2011   They try to eat only organic food.      Patient Active Problem List   Diagnosis Date Noted   Chest pain 07/20/2018   Tachycardia 07/20/2018   Elevated blood sugar 07/20/2018   Dyslipidemia 07/20/2018   Essential hypertension 07/20/2018   BPV (benign positional vertigo) 03/09/2018   DOE (dyspnea on exertion) 04/23/2014   Allergic rhinitis 06/29/2013   Paronychia 03/12/2013   Lower extremity edema 03/12/2013   Fever, unspecified 09/28/2012   Other and unspecified hyperlipidemia 09/28/2012   Plantar fasciitis 09/28/2012   Elevated blood pressure 04/24/2012   Chronic cough c/w upper airway cough syndrome from chronic sinusitis 04/24/2012   Family history of colon cancer 04/24/2012   Knee pain, bilateral 04/27/2011   Tinea pedis 04/27/2011   Obesity 04/27/2011   BPH (benign prostatic hyperplasia) 04/27/2011    Past Surgical History:  Procedure Laterality Date   Arthroscopic surgery  R knee   CYSTOSCOPY     prostate procedure?   TRIGGER FINGER RELEASE     L thumb       Family History  Problem Relation Age of Onset   Cancer Father 76       colon   Hypertension Brother        died age 88    Social History   Tobacco Use   Smoking status: Never   Smokeless tobacco: Never  Vaping Use   Vaping Use: Never used  Substance Use Topics   Alcohol use: No    Alcohol/week: 0.0 standard drinks   Drug use: No    Home Medications Prior to Admission medications   Medication Sig Start Date End Date Taking? Authorizing Provider   amLODipine (NORVASC) 2.5 MG tablet Take 1 tablet (2.5 mg total) by mouth daily. 07/20/18 10/18/18  Rollene Rotunda, MD  aspirin EC 81 MG tablet Take 81 mg by mouth daily.    [provider]  fluticasone (FLONASE) 50 MCG/ACT nasal spray Place 2 sprays into both nostrils daily. Patient taking differently: Place 2 sprays into both nostrils daily as needed.  06/14/16   Barrett Henle, PA-C  gabapentin (NEURONTIN) 300 MG capsule Take 300 mg by mouth 3 (three) times daily.    [provider]    Allergies    Patient has no known allergies.  Review of Systems   Review of Systems  Constitutional:  Negative for appetite change, diaphoresis and fever.  HENT:  Negative for sore throat.   Respiratory:  Positive for chest tightness and shortness of breath. Negative for cough.   Cardiovascular:  Positive for chest pain. Negative for palpitations and leg swelling.  Gastrointestinal:  Negative for abdominal pain, diarrhea, nausea and vomiting.  Musculoskeletal:  Positive for myalgias.  All other systems reviewed and are negative.  Physical Exam Updated Vital Signs BP (!) 158/93   Pulse 64   Temp 98.2 F (36.8 C) (Oral)   Resp 16   Ht 5\' 9"  (1.753 m)   Wt 86.2 kg   SpO2 98%   BMI 28.06 kg/m   Physical Exam Vitals and nursing note reviewed.  Constitutional:      General: He is not in acute distress.    Appearance: Normal appearance. He is well-developed. He is not ill-appearing or toxic-appearing.  HENT:     Head: Normocephalic and atraumatic.     Nose: Nose normal.     Mouth/Throat:     Mouth: Mucous membranes are moist.     Pharynx: Oropharynx is clear. No posterior oropharyngeal erythema.  Eyes:     General: No scleral icterus.    Extraocular Movements: Extraocular movements intact.     Pupils: Pupils are equal, round, and reactive to light.  Cardiovascular:     Rate and Rhythm: Normal rate and regular rhythm.     Pulses: Normal pulses.          Radial  pulses are 2+ on the right side and 2+ on the left side.       Dorsalis pedis pulses are 2+ on the right side and 2+ on the left side.     Heart sounds: Normal heart sounds. No murmur heard. Pulmonary:     Effort: Pulmonary effort is normal. No tachypnea or respiratory distress.     Breath sounds: Examination of the left-lower field reveals decreased breath sounds and rales. Decreased breath sounds and rales present.  Chest:     Chest wall:  No tenderness.  Abdominal:     General: Bowel sounds are normal.     Palpations: Abdomen is soft.  Musculoskeletal:        General: Normal range of motion.     Cervical back: Normal range of motion and neck supple.     Right lower leg: No tenderness. No edema.     Left lower leg: No tenderness. No edema.  Skin:    General: Skin is warm and dry.     Capillary Refill: Capillary refill takes less than 2 seconds.  Neurological:     General: No focal deficit present.     Mental Status: He is alert and oriented to person, place, and time.  Psychiatric:        Mood and Affect: Mood normal.        Behavior: Behavior normal.    ED Results / Procedures / Treatments   Labs (all labs ordered are listed, but only abnormal results are displayed) Labs Reviewed  BASIC METABOLIC PANEL - Abnormal; Notable for the following components:      Result Value   Sodium 133 (*)    Glucose, Bld 118 (*)    All other components within normal limits  RESP PANEL BY RT-PCR (FLU A&B, COVID) ARPGX2  CBC  TROPONIN I (HIGH SENSITIVITY)  TROPONIN I (HIGH SENSITIVITY)   EKG EKG Interpretation  Date/Time:  Saturday April 11 2021 13:24:54 EST Ventricular Rate:  64 PR Interval:  158 QRS Duration: 104 QT Interval:  396 QTC Calculation: 409 R Axis:   58 Text Interpretation: Sinus rhythm Inferior infarct, acute (LCx) new Minimal ST elevation, anterior leads Lateral leads are also involved Confirmed by Gwyneth Sprout ([id number]) on 04/11/2021 2:34:13 PM  Radiology DG  Chest 2 View  Result Date: 04/11/2021 CLINICAL DATA:  Chest pain and shortness of breath today. EXAM: CHEST - 2 VIEW COMPARISON:  June 13, 2020 FINDINGS: The heart size and mediastinal contours are within normal limits. Patchy opacity of the left lung base is identified. Minimal left pleural effusion is noted. There is no pulmonary edema. The visualized skeletal structures are unremarkable. IMPRESSION: Left lung base pneumonia with minimal left pleural effusion. Electronically Signed   By: Sherian Rein M.D.   On: 04/11/2021 11:08    Procedures Procedures   Medications Ordered in ED Medications  nitroGLYCERIN (NITROSTAT) SL tablet 0.4 mg (0.4 mg Sublingual Given 04/11/21 1508)    ED Course  I have reviewed the triage vital signs and the nursing notes.  Pertinent labs & imaging results that were available during my care of the patient were reviewed by me and considered in my medical decision making (see chart for details).  HEAR Score: 3   MDM Rules/Calculators/A&P 70 year old male who presents emergency department with chest pain and shortness of breath.  Care of patient being handed off to [place], [place] at this time.  At time of handoff patient has had initiation of work-up. Initial troponin negative, delta pending.  EKG does have some inferior ST elevations however there are no reciprocal changes.  He does have ongoing low-grade chest pain around 2-3 over 10.  We will give him a trial of nitro sublingual and reevaluate. Chest x-ray demonstrates left lung base pneumonia however this is not consistent with his presentation.  He does not have any fevers, cough, sick contacts.  Flu and COVID pending. Will obtain CTA PE study for shortness of breath  At time of handoff CTA PE study is pending.  Final diagnosis and  disposition will be based on CTA study. Final Clinical Impression(s) / ED Diagnoses Final diagnoses:  None    Rx / DC Orders ED Discharge Orders     None         Cristopher Peru, PA-C 04/11/21 1515    Mancel Bale, MD 04/11/21 [phone]

## 2021-04-11 NOTE — ED Notes (Signed)
Pt denies questions or concerns upon dc, pt ambulated out of ed with steady gait, pt states understanding of dc instructions and importance of follow up w/ PCP.

## 2021-04-14 DIAGNOSIS — J90 Pleural effusion, not elsewhere classified: Secondary | ICD-10-CM | POA: Diagnosis not present

## 2021-04-14 DIAGNOSIS — R079 Chest pain, unspecified: Secondary | ICD-10-CM | POA: Diagnosis not present

## 2021-04-14 DIAGNOSIS — E785 Hyperlipidemia, unspecified: Secondary | ICD-10-CM | POA: Diagnosis not present

## 2021-04-17 DIAGNOSIS — J069 Acute upper respiratory infection, unspecified: Secondary | ICD-10-CM | POA: Diagnosis not present

## 2021-04-17 DIAGNOSIS — Z20822 Contact with and (suspected) exposure to covid-19: Secondary | ICD-10-CM | POA: Diagnosis not present

## 2021-05-05 DIAGNOSIS — H524 Presbyopia: Secondary | ICD-10-CM | POA: Diagnosis not present

## 2021-05-05 DIAGNOSIS — H5203 Hypermetropia, bilateral: Secondary | ICD-10-CM | POA: Diagnosis not present

## 2021-05-05 DIAGNOSIS — H25813 Combined forms of age-related cataract, bilateral: Secondary | ICD-10-CM | POA: Diagnosis not present

## 2021-05-05 DIAGNOSIS — H4321 Crystalline deposits in vitreous body, right eye: Secondary | ICD-10-CM | POA: Diagnosis not present

## 2021-05-27 ENCOUNTER — Encounter (HOSPITAL_COMMUNITY): Payer: Self-pay

## 2021-05-27 ENCOUNTER — Emergency Department (HOSPITAL_COMMUNITY): Payer: Medicare Other

## 2021-05-27 ENCOUNTER — Emergency Department (HOSPITAL_COMMUNITY)
Admission: EM | Admit: 2021-05-27 | Discharge: 2021-05-28 | Disposition: A | Discharge status: Home / Self Care | Payer: Medicare Other | Attending: Emergency Medicine | Admitting: Emergency Medicine

## 2021-05-27 ENCOUNTER — Other Ambulatory Visit: Payer: Self-pay

## 2021-05-27 DIAGNOSIS — R0789 Other chest pain: Secondary | ICD-10-CM | POA: Insufficient documentation

## 2021-05-27 DIAGNOSIS — Z7982 Long term (current) use of aspirin: Secondary | ICD-10-CM | POA: Insufficient documentation

## 2021-05-27 DIAGNOSIS — R079 Chest pain, unspecified: Secondary | ICD-10-CM | POA: Diagnosis not present

## 2021-05-27 LAB — BASIC METABOLIC PANEL
Anion gap: 8 (ref 5–15)
BUN: 27 mg/dL — ABNORMAL HIGH (ref 8–23)
CO2: 25 mmol/L (ref 22–32)
Calcium: 8.8 mg/dL — ABNORMAL LOW (ref 8.9–10.3)
Chloride: 102 mmol/L (ref 98–111)
Creatinine, Ser: 1.29 mg/dL — ABNORMAL HIGH (ref 0.61–1.24)
GFR, Estimated: >60 mL/min (ref >60)
Glucose, Bld: 128 mg/dL — ABNORMAL HIGH (ref 70–99)
Potassium: 3.8 mmol/L (ref 3.5–5.1)
Sodium: 135 mmol/L (ref 135–145)

## 2021-05-27 LAB — CBC
HCT: 42.7 % (ref 39.0–52.0)
Hemoglobin: 14.5 g/dL (ref 13.0–17.0)
MCH: 29.4 pg (ref 26.0–34.0)
MCHC: 34 g/dL (ref 30.0–36.0)
MCV: 86.6 fL (ref 80.0–100.0)
Platelets: 276 10*3/uL (ref 150–400)
RBC: 4.93 MIL/uL (ref 4.22–5.81)
RDW: 14.3 % (ref 11.5–15.5)
WBC: 10.1 10*3/uL (ref 4.0–10.5)
nRBC: 0 % (ref 0.0–0.2)

## 2021-05-27 LAB — TROPONIN I (HIGH SENSITIVITY): Troponin I (High Sensitivity): 3 ng/L (ref <18)

## 2021-05-27 NOTE — ED Triage Notes (Signed)
Pt reports sudden severe left sided chest pains radiating from left shoulder across the chest. Pain began 45 minutes prior to arrival while in the shower. Denies pmh, nka. Took 324mg  ASA pta.

## 2021-05-28 LAB — TROPONIN I (HIGH SENSITIVITY)
Troponin I (High Sensitivity): 3 ng/L (ref <18)
Troponin I (High Sensitivity): 3 ng/L (ref <18)

## 2021-05-28 LAB — D-DIMER, QUANTITATIVE: D-Dimer, Quant: <0.27 ug/mL-FEU (ref 0.00–0.50)

## 2021-05-28 MED ORDER — KETOROLAC TROMETHAMINE 15 MG/ML IJ SOLN
7.5000 mg | Freq: Once | INTRAMUSCULAR | Status: AC
  Administered 2021-05-28: 02:00:00 7.5 mg via INTRAVENOUS
  Filled 2021-05-28: qty 1

## 2021-05-28 NOTE — ED Provider Notes (Signed)
[facility] Provider Note: [name], MD, FACEP  CSN: [id number] MRN: [id number] ARRIVAL: 05/27/21 at 2149 ROOM: [facility]   CHIEF COMPLAINT  Chest Pain   HISTORY OF PRESENT ILLNESS  05/28/21 1:37 AM Daryl Levine is a 70 y.o. male who had the sudden onset of chest pain when he was taking a shower about 9 PM yesterday evening.  The pain is located in his upper chest from the left shoulder across to the right shoulder and into his back.  It is worse with movement, especially of the arms.  He denies any associated shortness of breath, nausea, vomiting or diaphoresis.  He rated it as a 10 out of 10 at its worst but it has subsequently improved but is still present.  He took 324 mg of aspirin prior to arrival.   Past Medical History:  Diagnosis Date   Allergic rhinitis 06/29/2013   BPH (benign prostatic hyperplasia)    BPV (benign positional vertigo) 03/09/2018   Chronic cough c/w upper airway cough syndrome from chronic sinusitis 04/24/2012   Onset 2011  PFTs 04/2014: no obstruction - Allergy profile 02/08/2018 >  Eos 0.2 /  IgE  9 RAST neg  - Sinus CT 02/20/2018  Right maxillary sinusitis.Leftward septal deviation. > rx Augmentin 875 bid x 20 days > resolved as of 03/08/2018    DOE (dyspnea on exertion) 04/23/2014   PFTs 04/2014:  No obstruction, no restriction, DLCO inaccurate due to poor technique.  - 02/08/2018  Walked RA x 3 laps @ 185 ft each stopped due to  End of study, nl pace, no sob or desat    - Spirometry 02/08/2018  FEV1 3.0 (106%)  Ratio 80 s curvature off all rx     Elevated blood pressure 04/24/2012   Associated with 11 lb weight gain.     Family history of colon cancer 04/24/2012   He plans to schedule colonoscopy next office visit    Hyperlipidemia    Obesity 04/27/2011   They try to eat only organic food.      Past Surgical History:  Procedure Laterality Date   Arthroscopic surgery     R knee   CYSTOSCOPY     prostate procedure?   TRIGGER FINGER  RELEASE     L thumb    Family History  Problem Relation Age of Onset   Cancer Father 65       colon   Hypertension Brother        died age 50    Social History   Tobacco Use   Smoking status: Never   Smokeless tobacco: Never  Vaping Use   Vaping Use: Never used  Substance Use Topics   Alcohol use: No    Alcohol/week: 0.0 standard drinks   Drug use: No    Prior to Admission medications   Medication Sig Start Date End Date Taking? Authorizing Provider  amLODipine (NORVASC) 2.5 MG tablet Take 1 tablet (2.5 mg total) by mouth daily. 07/20/18 10/18/18  Rollene Rotunda, MD  aspirin EC 81 MG tablet Take 81 mg by mouth daily.    [provider]  fluticasone (FLONASE) 50 MCG/ACT nasal spray Place 2 sprays into both nostrils daily. Patient taking differently: Place 2 sprays into both nostrils daily as needed.  06/14/16   Barrett Henle, PA-C  gabapentin (NEURONTIN) 300 MG capsule Take 300 mg by mouth 3 (three) times daily.    [provider]    Allergies Patient has no known allergies.  REVIEW OF SYSTEMS  Negative except as noted here or in the History of Present Illness.   PHYSICAL EXAMINATION  Initial Vital Signs Blood pressure (!) 152/91, pulse 73, temperature 98.3 F (36.8 C), temperature source Oral, resp. rate (!) 24, height 5\' 9"  (1.753 m), weight 86.2 kg, SpO2 96 %.  Examination General: Well-developed, well-nourished male in no acute distress; appearance consistent with age of record HENT: normocephalic; atraumatic Eyes: pupils equal, round and reactive to light; extraocular muscles intact Neck: supple Heart: regular rate and rhythm; no murmur Lungs: clear to auscultation bilaterally Chest: Sternal tenderness Abdomen: soft; nondistended; nontender; bowel sounds present Extremities: No deformity; full range of motion; pulses normal Neurologic: Awake, alert and oriented; motor function intact in all extremities and symmetric; no facial  droop Skin: Warm and dry Psychiatric: Flat affect   RESULTS  Summary of this visit's results, reviewed and interpreted by myself:   EKG Interpretation  Date/Time:  Wednesday May 27 2021 21:50:09 EST Ventricular Rate:  82 PR Interval:  170 QRS Duration: 96 QT Interval:  368 QTC Calculation: 430 R Axis:   51 Text Interpretation: Sinus rhythm Minimal ST elevation, inferior leads No significant change was found Confirmed by Rayven Rettig, 03-03-1978 (Jonny Ruiz) on 05/27/2021 10:56:11 PM       Laboratory Studies: Results for orders placed or performed during the hospital encounter of 05/27/21 (from the past 24 hour(s))  Basic metabolic panel     Status: Abnormal   Collection Time: 05/27/21 10:14 PM  Result Value Ref Range   Sodium 135 135 - 145 mmol/L   Potassium 3.8 3.5 - 5.1 mmol/L   Chloride 102 98 - 111 mmol/L   CO2 25 22 - 32 mmol/L   Glucose, Bld 128 (H) 70 - 99 mg/dL   BUN 27 (H) 8 - 23 mg/dL   Creatinine, Ser 05/29/21 (H) 0.61 - 1.24 mg/dL   Calcium 8.8 (L) 8.9 - 10.3 mg/dL   GFR, Estimated 3.22 >02 mL/min   Anion gap 8 5 - 15  CBC     Status: None   Collection Time: 05/27/21 10:14 PM  Result Value Ref Range   WBC 10.1 4.0 - 10.5 K/uL   RBC 4.93 4.22 - 5.81 MIL/uL   Hemoglobin 14.5 13.0 - 17.0 g/dL   HCT 05/29/21 27.0 - 62.3 %   MCV 86.6 80.0 - 100.0 fL   MCH 29.4 26.0 - 34.0 pg   MCHC 34.0 30.0 - 36.0 g/dL   RDW 76.2 83.1 - 51.7 %   Platelets 276 150 - 400 K/uL   nRBC 0.0 0.0 - 0.2 %  Troponin I (High Sensitivity)     Status: None   Collection Time: 05/27/21 10:14 PM  Result Value Ref Range   Troponin I (High Sensitivity) 3 <18 ng/L  Troponin I (High Sensitivity)     Status: None   Collection Time: 05/27/21 11:56 PM  Result Value Ref Range   Troponin I (High Sensitivity) 3 <18 ng/L  D-dimer, quantitative     Status: None   Collection Time: 05/28/21  1:57 AM  Result Value Ref Range   D-Dimer, Quant <0.27 0.00 - 0.50 ug/mL-FEU  Troponin I (High Sensitivity)     Status: None    Collection Time: 05/28/21  1:57 AM  Result Value Ref Range   Troponin I (High Sensitivity) 3 <18 ng/L   Imaging Studies: DG Chest 2 View  Result Date: 05/27/2021 CLINICAL DATA:  Left-sided chest pain EXAM: CHEST - 2 VIEW COMPARISON:  04/11/2021  FINDINGS: Cardiac size upper limits of normal. No focal opacity, pleural effusion or pneumothorax. IMPRESSION: No active cardiopulmonary disease. Electronically Signed   By: Jasmine Pang M.D.   On: 05/27/2021 22:28    ED COURSE and MDM  Nursing notes, initial and subsequent vitals signs, including pulse oximetry, reviewed and interpreted by myself.  Vitals:   05/27/21 2155 05/27/21 2200 05/27/21 2300 05/28/21 0215  BP:  (!) 144/72 (!) 152/91 (!) 149/91  Pulse:  75 73 85  Resp:  (!) 26 (!) 24 20  Temp:      TempSrc:      SpO2:  99% 96% 97%  Weight: 86.2 kg     Height: 5\' 9"  (1.753 m)      Medications  ketorolac (TORADOL) 15 MG/ML injection 7.5 mg (7.5 mg Intravenous Given 05/28/21 0204)   3:00 AM Patient's pain improved (5 out of 10) after IV Toradol.  He no longer has pain on movement of his arms or with palpation of the sternum.  His pain is atypical for cardiac etiology.  His D-dimer is normal which makes pulmonary embolism or dissection less likely.  He was advised to return if symptoms worsen.  He was advised to take Aleve or ibuprofen in the meantime.   PROCEDURES  Procedures   ED DIAGNOSES     ICD-10-CM   1. Chest wall pain  R07.89          08-08-1986, MD 05/28/21 [phone]

## 2021-07-17 DIAGNOSIS — H547 Unspecified visual loss: Secondary | ICD-10-CM | POA: Diagnosis not present

## 2021-07-17 DIAGNOSIS — Z0000 Encounter for general adult medical examination without abnormal findings: Secondary | ICD-10-CM | POA: Diagnosis not present

## 2021-07-17 DIAGNOSIS — R030 Elevated blood-pressure reading, without diagnosis of hypertension: Secondary | ICD-10-CM | POA: Diagnosis not present

## 2021-07-17 DIAGNOSIS — R739 Hyperglycemia, unspecified: Secondary | ICD-10-CM | POA: Diagnosis not present

## 2021-07-17 DIAGNOSIS — E785 Hyperlipidemia, unspecified: Secondary | ICD-10-CM | POA: Diagnosis not present

## 2021-07-17 DIAGNOSIS — Z125 Encounter for screening for malignant neoplasm of prostate: Secondary | ICD-10-CM | POA: Diagnosis not present

## 2021-08-10 DIAGNOSIS — B349 Viral infection, unspecified: Secondary | ICD-10-CM | POA: Diagnosis not present

## 2021-08-10 DIAGNOSIS — Z20822 Contact with and (suspected) exposure to covid-19: Secondary | ICD-10-CM | POA: Diagnosis not present

## 2021-08-13 DIAGNOSIS — J019 Acute sinusitis, unspecified: Secondary | ICD-10-CM | POA: Diagnosis not present

## 2021-11-30 DIAGNOSIS — H524 Presbyopia: Secondary | ICD-10-CM | POA: Diagnosis not present

## 2022-02-13 IMAGING — CR DG CHEST 2V
2 series · 2 of 2 positions shown · non-contrast
Comparison: 04/11/2021

CLINICAL DATA: Left-sided chest pain

EXAM:
CHEST - 2 VIEW

[w chest lat]
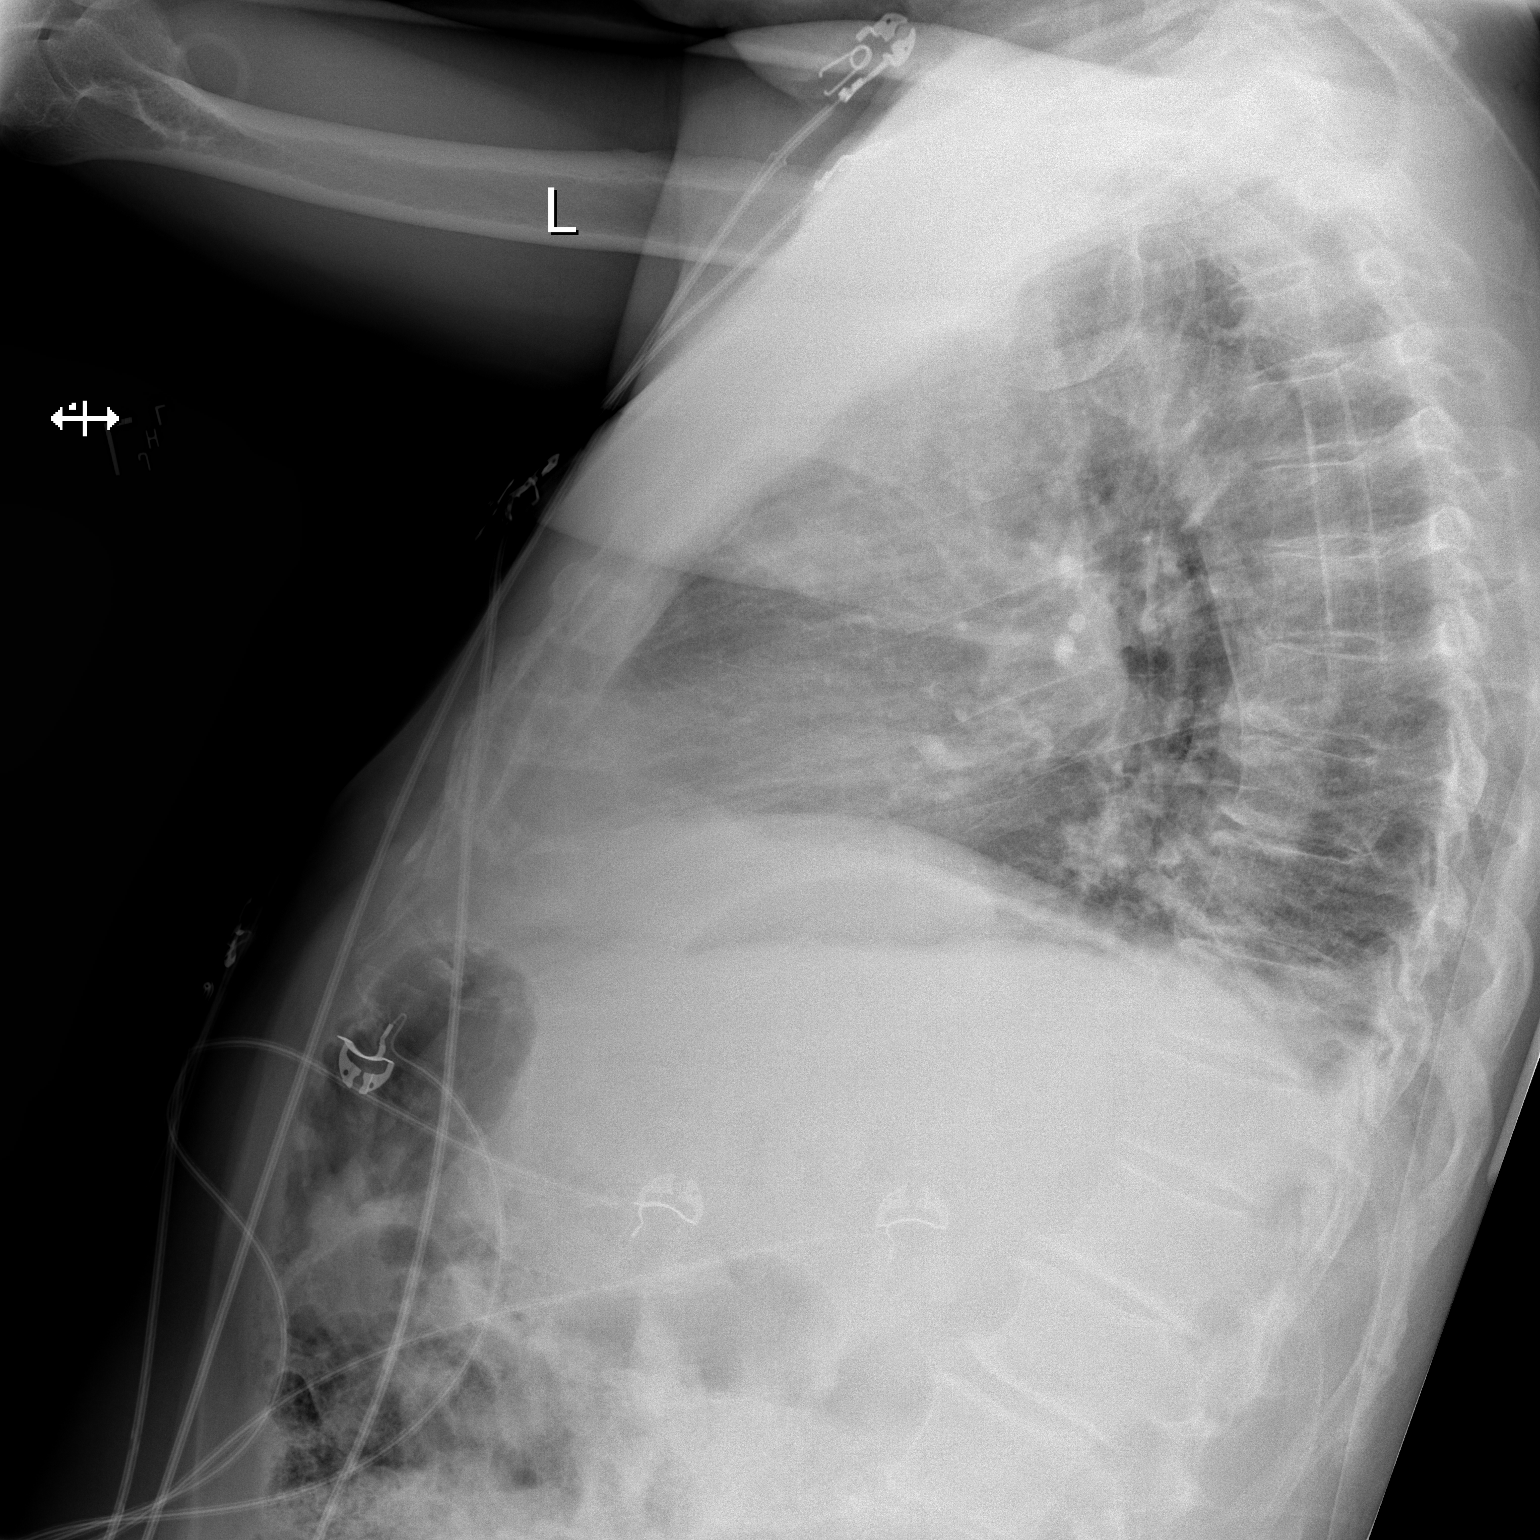

[x chest ap]
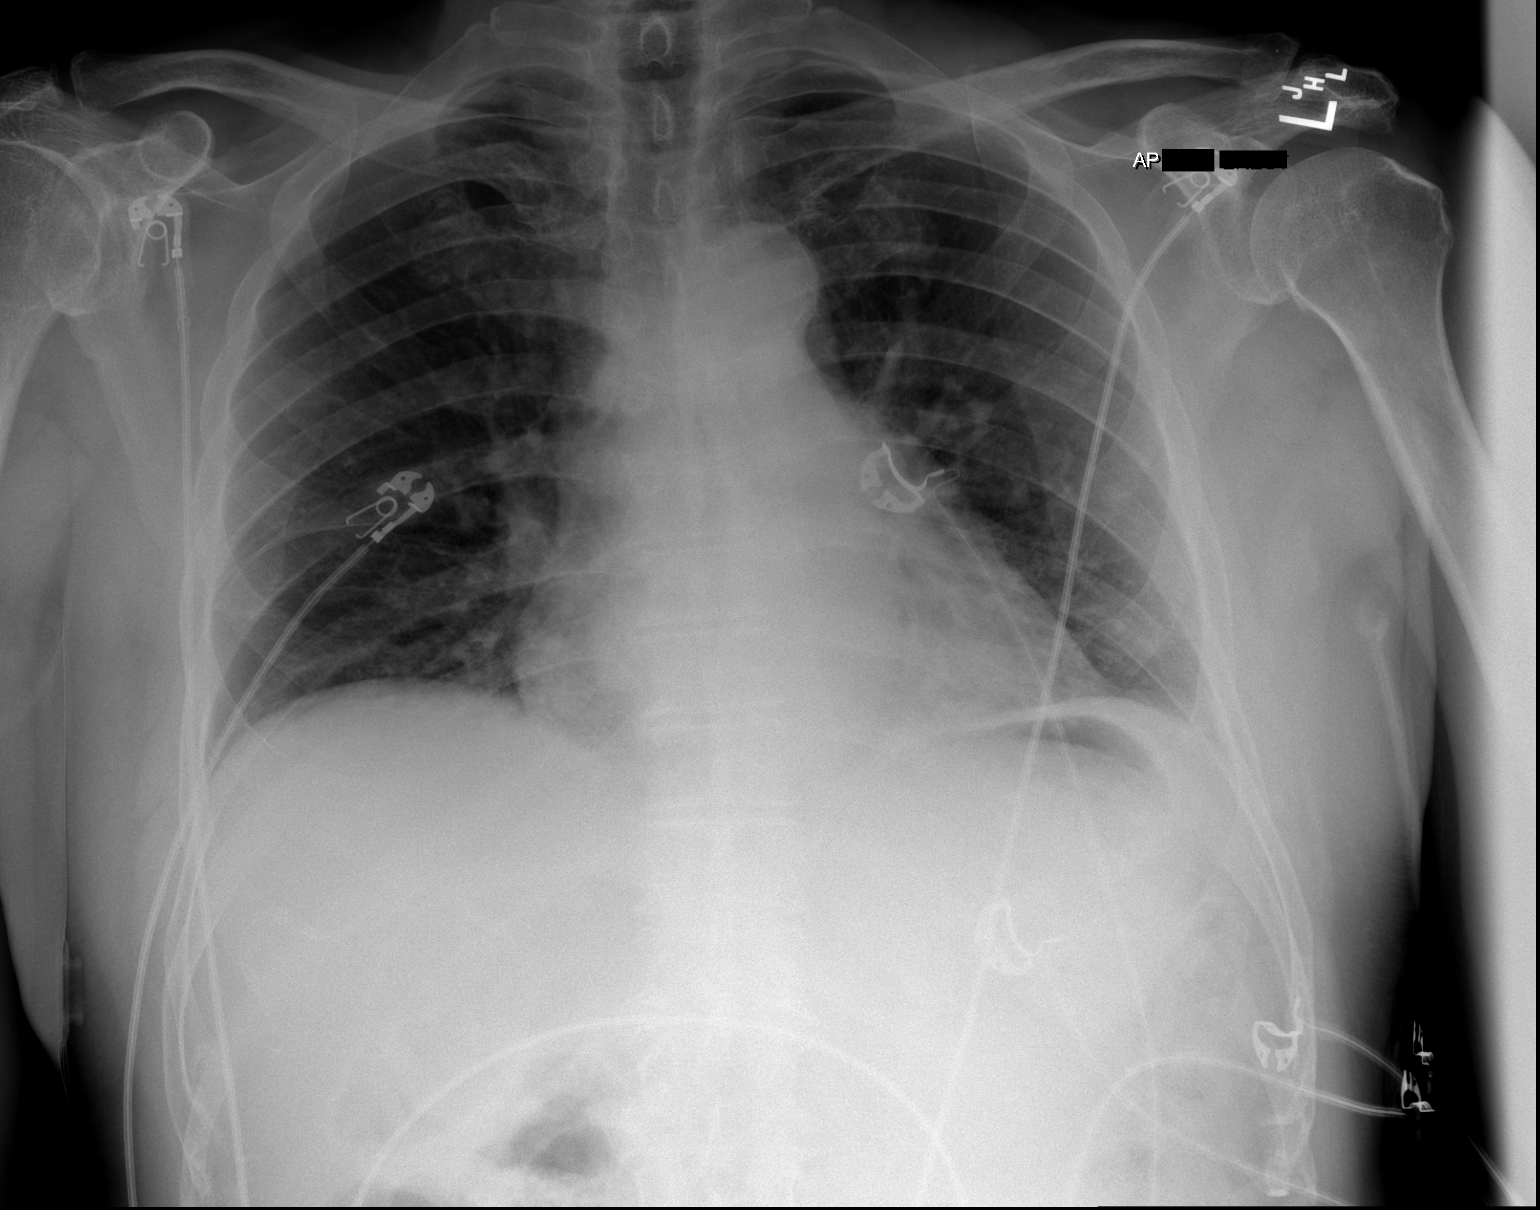

[2 of 2 positions shown; findings below may reference images not displayed]

FINDINGS: Cardiac size upper limits of normal. No focal opacity, pleural
effusion or pneumothorax.
IMPRESSION: No active cardiopulmonary disease.

## 2022-02-23 DIAGNOSIS — J069 Acute upper respiratory infection, unspecified: Secondary | ICD-10-CM | POA: Diagnosis not present

## 2022-02-23 DIAGNOSIS — H66003 Acute suppurative otitis media without spontaneous rupture of ear drum, bilateral: Secondary | ICD-10-CM | POA: Diagnosis not present

## 2022-08-06 DIAGNOSIS — R3912 Poor urinary stream: Secondary | ICD-10-CM | POA: Diagnosis not present

## 2022-08-06 DIAGNOSIS — N401 Enlarged prostate with lower urinary tract symptoms: Secondary | ICD-10-CM | POA: Diagnosis not present

## 2022-08-06 DIAGNOSIS — Z125 Encounter for screening for malignant neoplasm of prostate: Secondary | ICD-10-CM | POA: Diagnosis not present

## 2022-08-06 DIAGNOSIS — R1030 Lower abdominal pain, unspecified: Secondary | ICD-10-CM | POA: Diagnosis not present

## 2022-09-14 DIAGNOSIS — R739 Hyperglycemia, unspecified: Secondary | ICD-10-CM | POA: Diagnosis not present

## 2022-09-14 DIAGNOSIS — E785 Hyperlipidemia, unspecified: Secondary | ICD-10-CM | POA: Diagnosis not present

## 2022-09-14 DIAGNOSIS — M25561 Pain in right knee: Secondary | ICD-10-CM | POA: Diagnosis not present

## 2022-09-14 DIAGNOSIS — Z0000 Encounter for general adult medical examination without abnormal findings: Secondary | ICD-10-CM | POA: Diagnosis not present

## 2022-09-14 DIAGNOSIS — R030 Elevated blood-pressure reading, without diagnosis of hypertension: Secondary | ICD-10-CM | POA: Diagnosis not present

## 2022-09-14 DIAGNOSIS — H538 Other visual disturbances: Secondary | ICD-10-CM | POA: Diagnosis not present

## 2022-09-15 ENCOUNTER — Encounter (HOSPITAL_COMMUNITY): Payer: Self-pay

## 2022-09-15 ENCOUNTER — Ambulatory Visit (HOSPITAL_COMMUNITY)
Admission: EM | Admit: 2022-09-15 | Discharge: 2022-09-15 | Disposition: A | Discharge status: Home / Self Care | Payer: Medicare Other | Attending: Internal Medicine | Admitting: Internal Medicine

## 2022-09-15 DIAGNOSIS — R42 Dizziness and giddiness: Secondary | ICD-10-CM | POA: Diagnosis not present

## 2022-09-15 DIAGNOSIS — H8113 Benign paroxysmal vertigo, bilateral: Secondary | ICD-10-CM

## 2022-09-15 DIAGNOSIS — R112 Nausea with vomiting, unspecified: Secondary | ICD-10-CM | POA: Diagnosis not present

## 2022-09-15 MED ORDER — MECLIZINE HCL 12.5 MG PO TABS: 12.5000 mg | ORAL_TABLET | Freq: Three times a day (TID) | ORAL | 0 refills | Status: AC | PRN

## 2022-09-15 MED ORDER — ONDANSETRON 4 MG PO TBDP: 4.0000 mg | ORAL_TABLET | Freq: Three times a day (TID) | ORAL | 0 refills | Status: AC | PRN

## 2022-09-15 NOTE — ED Provider Notes (Signed)
[facility]    CSN: [id number] Arrival date & time: 09/15/22  7829      History   Chief Complaint Chief Complaint  Patient presents with   Dizziness   Emesis    HPI Daryl Levine is a 72 y.o. male.   Patient presents to urgent care for evaluation of dizziness, nausea, and vomiting that started this morning upon waking.  He states when he woke up, he has felt the room was spinning.  Dizziness is worsened by forward flexion and extension of the head at the neck.  Unsure of anything that makes the dizziness better.  Experience 1-2 episodes of nonbloody/nonbilious emesis this morning due to dizziness.  No recent head injuries, headache, chest pain, heart palpitations, diarrhea, abdominal pain, back pain, paresthesias, extremity weakness, or tinnitus.  He states this is never happened in the past, however he does have history of benign positional vertigo per his chart review. No urinary symptoms or recent antibiotic/steroid use. No recent medication changes. He has not attempted use of any over the counter medications to help with symptoms.    Dizziness Associated symptoms: vomiting   Emesis   Past Medical History:  Diagnosis Date   Allergic rhinitis 06/29/2013   BPH (benign prostatic hyperplasia)    BPV (benign positional vertigo) 03/09/2018   Chronic cough c/w upper airway cough syndrome from chronic sinusitis 04/24/2012   Onset 2011  PFTs 04/2014: no obstruction - Allergy profile 02/08/2018 >  Eos 0.2 /  IgE  9 RAST neg  - Sinus CT 02/20/2018  Right maxillary sinusitis.Leftward septal deviation. > rx Augmentin 875 bid x 20 days > resolved as of 03/08/2018    DOE (dyspnea on exertion) 04/23/2014   PFTs 04/2014:  No obstruction, no restriction, DLCO inaccurate due to poor technique.  - 02/08/2018  Walked RA x 3 laps @ 185 ft each stopped due to  End of study, nl pace, no sob or desat    - Spirometry 02/08/2018  FEV1 3.0 (106%)  Ratio 80 s curvature off all rx     Elevated blood  pressure 04/24/2012   Associated with 11 lb weight gain.     Family history of colon cancer 04/24/2012   He plans to schedule colonoscopy next office visit    Hyperlipidemia    Obesity 04/27/2011   They try to eat only organic food.      Patient Active Problem List   Diagnosis Date Noted   Chest pain 07/20/2018   Tachycardia 07/20/2018   Elevated blood sugar 07/20/2018   Dyslipidemia 07/20/2018   Essential hypertension 07/20/2018   BPV (benign positional vertigo) 03/09/2018   DOE (dyspnea on exertion) 04/23/2014   Allergic rhinitis 06/29/2013   Paronychia 03/12/2013   Lower extremity edema 03/12/2013   Fever, unspecified 09/28/2012   Other and unspecified hyperlipidemia 09/28/2012   Plantar fasciitis 09/28/2012   Elevated blood pressure 04/24/2012   Chronic cough c/w upper airway cough syndrome from chronic sinusitis 04/24/2012   Family history of colon cancer 04/24/2012   Knee pain, bilateral 04/27/2011   Tinea pedis 04/27/2011   Obesity 04/27/2011   BPH (benign prostatic hyperplasia) 04/27/2011    Past Surgical History:  Procedure Laterality Date   Arthroscopic surgery     R knee   CYSTOSCOPY     prostate procedure?   TRIGGER FINGER RELEASE     L thumb       Home Medications    Prior to Admission medications   Medication Sig Start  Date End Date Taking? Authorizing Provider  meclizine (ANTIVERT) 12.5 MG tablet Take 1 tablet (12.5 mg total) by mouth 3 (three) times daily as needed for dizziness. 09/15/22  Yes Carlisle Beers, FNP  ondansetron (ZOFRAN-ODT) 4 MG disintegrating tablet Take 1 tablet (4 mg total) by mouth every 8 (eight) hours as needed for nausea or vomiting. 09/15/22  Yes Carlisle Beers, FNP  aspirin EC 81 MG tablet Take 81 mg by mouth daily.    [provider]    Family History Family History  Problem Relation Age of Onset   Cancer Father 8       colon   Hypertension Brother        died age 35    Social  History Social History   Tobacco Use   Smoking status: Never   Smokeless tobacco: Never  Vaping Use   Vaping Use: Never used  Substance Use Topics   Alcohol use: No    Alcohol/week: 0.0 standard drinks of alcohol   Drug use: No     Allergies   Patient has no known allergies.   Review of Systems Review of Systems  Gastrointestinal:  Positive for vomiting.  Neurological:  Positive for dizziness.  Per HPI   Physical Exam Triage Vital Signs ED Triage Vitals  Enc Vitals Group     BP 09/15/22 1113 (!) 167/87     Pulse Rate 09/15/22 1113 70     Resp 09/15/22 1113 18     Temp 09/15/22 1113 97.6 F (36.4 C)     Temp src --      SpO2 09/15/22 1113 97 %     Weight --      Height --      Head Circumference --      Peak Flow --      Pain Score 09/15/22 1114 0     Pain Loc --      Pain Edu? --      Excl. in GC? --    No data found.  Updated Vital Signs BP (!) 167/87 (BP Location: Right Arm)   Pulse 70   Temp 97.6 F (36.4 C)   Resp 18   SpO2 97%   Visual Acuity Right Eye Distance:   Left Eye Distance:   Bilateral Distance:    Right Eye Near:   Left Eye Near:    Bilateral Near:     Physical Exam Vitals and nursing note reviewed.  Constitutional:      Appearance: He is not ill-appearing or toxic-appearing.  HENT:     Head: Normocephalic and atraumatic.     Right Ear: Hearing, tympanic membrane, ear canal and external ear normal.     Left Ear: Hearing, tympanic membrane, ear canal and external ear normal.     Nose: Nose normal.     Mouth/Throat:     Lips: Pink.     Mouth: Mucous membranes are moist. No injury.     Tongue: No lesions. Tongue does not deviate from midline.     Palate: No mass and lesions.     Pharynx: Oropharynx is clear. Uvula midline. No pharyngeal swelling, oropharyngeal exudate, posterior oropharyngeal erythema or uvula swelling.     Tonsils: No tonsillar exudate or tonsillar abscesses.  Eyes:     General: Lids are normal. Vision  grossly intact. Gaze aligned appropriately.     Extraocular Movements: Extraocular movements intact.     Conjunctiva/sclera: Conjunctivae normal.     Comments: EOMs intact  without pain or dizziness elicited.   Cardiovascular:     Rate and Rhythm: Normal rate and regular rhythm.     Heart sounds: Normal heart sounds, S1 normal and S2 normal.  Pulmonary:     Effort: Pulmonary effort is normal. No respiratory distress.     Breath sounds: Normal breath sounds and air entry.  Musculoskeletal:     Cervical back: Full passive range of motion without pain and neck supple. No spinous process tenderness or muscular tenderness. Normal range of motion.  Skin:    General: Skin is warm and dry.     Capillary Refill: Capillary refill takes less than 2 seconds.     Findings: No rash.  Neurological:     General: No focal deficit present.     Mental Status: He is alert and oriented to person, place, and time. Mental status is at baseline.     Cranial Nerves: Cranial nerves 2-12 are intact. No dysarthria or facial asymmetry.     Sensory: Sensation is intact.     Motor: Motor function is intact.     Coordination: Coordination is intact.     Gait: Gait is intact.     Comments: Strength and sensation intact to bilateral upper and lower extremities (5/5). Moves all 4 extremities with normal coordination voluntarily. Non-focal neuro exam.   Psychiatric:        Mood and Affect: Mood normal.        Speech: Speech normal.        Behavior: Behavior normal.        Thought Content: Thought content normal.        Judgment: Judgment normal.      UC Treatments / Results  Labs (all labs ordered are listed, but only abnormal results are displayed) Labs Reviewed - No data to display  EKG   Radiology No results found.  Procedures Procedures (including critical care time)  Medications Ordered in UC Medications - No data to display  Initial Impression / Assessment and Plan / UC Course  I have reviewed  the triage vital signs and the nursing notes.  Pertinent labs & imaging results that were available during my care of the patient were reviewed by me and considered in my medical decision making (see chart for details).   1.  1.  Benign positional vertigo, dizziness, nausea and vomiting Presentation is consistent with BPPV etiology.  Doubt acute CVA, MI, dehydration, or intracranial abnormality.  Neurologic exam is stable and without focal deficit.  Cardiopulmonary exam is clear, therefore deferred imaging of the lungs. Patient appears to be well-hydrated and vital signs are hemodynamically stable.  Will manage this with Zofran 4 mg every 8 hours as needed for nausea and vomiting as well as meclizine 12.5 mg every 8 hours as needed for dizziness.  Discussed drowsiness precautions regarding meclizine use.  Advised rest for the rest of the day and increase fluid intake to promote hydration.  Strict ER and urgent care return precautions discussed.  No indication for further evaluation or imaging in the emergency department at this time based on presentation in clinic.  Discussed physical exam and available lab work findings in clinic with patient.  Counseled patient regarding appropriate use of medications and potential side effects for all medications recommended or prescribed today. Discussed red flag signs and symptoms of worsening condition,when to call the PCP office, return to urgent care, and when to seek higher level of care in the emergency department. Patient verbalizes understanding and  agreement with plan. All questions answered. Patient discharged in stable condition.    Final Clinical Impressions(s) / UC Diagnoses   Final diagnoses:  Benign paroxysmal positional vertigo due to bilateral vestibular disorder  Dizziness  Nausea and vomiting, unspecified vomiting type     Discharge Instructions      Your symptoms are likely due to benign positional vertigo which is an inner ear  problem.  Please review the information I provided in your packet for further information regarding this inner ear disease.  I gave you some Zofran in the clinic to help with your nausea today.  You may take meclizine 12.5 mg every 8 hours as needed for dizziness. This medication may make you sleepy, so do not use any power tools, drive, or go to work today.  When you take this medication, lay down and rest.  If your symptoms do not improve in the next 24 to 48 hours with use of meclizine, please return to urgent care.  If you develop any new or worsening symptoms that are severe, please go to the nearest emergency room for further evaluation.  Follow-up with your primary care provider regarding this problem in 1 to 2 weeks to ensure that symptoms have improved. I hope you feel better!    ED Prescriptions     Medication Sig Dispense Auth. Provider   meclizine (ANTIVERT) 12.5 MG tablet Take 1 tablet (12.5 mg total) by mouth 3 (three) times daily as needed for dizziness. 30 tablet Reita May M, FNP   ondansetron (ZOFRAN-ODT) 4 MG disintegrating tablet Take 1 tablet (4 mg total) by mouth every 8 (eight) hours as needed for nausea or vomiting. 20 tablet Carlisle Beers, FNP      PDMP not reviewed this encounter.   Carlisle Beers, [place] 09/18/22 2132

## 2022-09-15 NOTE — Discharge Instructions (Addendum)
Your symptoms are likely due to benign positional vertigo which is an inner ear problem.  Please review the information I provided in your packet for further information regarding this inner ear disease.  I gave you some Zofran in the clinic to help with your nausea today.  You may take meclizine 12.5 mg every 8 hours as needed for dizziness. This medication may make you sleepy, so do not use any power tools, drive, or go to work today.  When you take this medication, lay down and rest.  If your symptoms do not improve in the next 24 to 48 hours with use of meclizine, please return to urgent care.  If you develop any new or worsening symptoms that are severe, please go to the nearest emergency room for further evaluation.  Follow-up with your primary care provider regarding this problem in 1 to 2 weeks to ensure that symptoms have improved. I hope you feel better!

## 2022-09-15 NOTE — ED Triage Notes (Signed)
Pt states had injections to rt knee yesterday. States woke up very dizzy this morning and vomit x2. States his blood pressure is elevated this morning. Denies pain or blurred vision.

## 2022-10-18 DIAGNOSIS — M25561 Pain in right knee: Secondary | ICD-10-CM | POA: Diagnosis not present

## 2022-11-19 DIAGNOSIS — H5203 Hypermetropia, bilateral: Secondary | ICD-10-CM | POA: Diagnosis not present

## 2022-11-19 DIAGNOSIS — H25813 Combined forms of age-related cataract, bilateral: Secondary | ICD-10-CM | POA: Diagnosis not present

## 2022-11-19 DIAGNOSIS — H524 Presbyopia: Secondary | ICD-10-CM | POA: Diagnosis not present

## 2022-11-19 DIAGNOSIS — H538 Other visual disturbances: Secondary | ICD-10-CM | POA: Diagnosis not present

## 2022-11-19 DIAGNOSIS — H4321 Crystalline deposits in vitreous body, right eye: Secondary | ICD-10-CM | POA: Diagnosis not present

## 2022-12-09 DIAGNOSIS — R351 Nocturia: Secondary | ICD-10-CM | POA: Diagnosis not present

## 2022-12-09 DIAGNOSIS — N401 Enlarged prostate with lower urinary tract symptoms: Secondary | ICD-10-CM | POA: Diagnosis not present

## 2022-12-09 DIAGNOSIS — R3911 Hesitancy of micturition: Secondary | ICD-10-CM | POA: Diagnosis not present

## 2022-12-09 DIAGNOSIS — R3912 Poor urinary stream: Secondary | ICD-10-CM | POA: Diagnosis not present

## 2022-12-20 DIAGNOSIS — E785 Hyperlipidemia, unspecified: Secondary | ICD-10-CM | POA: Diagnosis not present

## 2022-12-20 DIAGNOSIS — R739 Hyperglycemia, unspecified: Secondary | ICD-10-CM | POA: Diagnosis not present

## 2023-03-07 DIAGNOSIS — G8929 Other chronic pain: Secondary | ICD-10-CM | POA: Diagnosis not present

## 2023-03-07 DIAGNOSIS — I10 Essential (primary) hypertension: Secondary | ICD-10-CM | POA: Diagnosis not present

## 2023-03-07 DIAGNOSIS — M545 Low back pain, unspecified: Secondary | ICD-10-CM | POA: Diagnosis not present

## 2023-03-07 DIAGNOSIS — R102 Pelvic and perineal pain: Secondary | ICD-10-CM | POA: Diagnosis not present

## 2023-03-08 DIAGNOSIS — M47817 Spondylosis without myelopathy or radiculopathy, lumbosacral region: Secondary | ICD-10-CM | POA: Diagnosis not present

## 2023-03-08 DIAGNOSIS — M19032 Primary osteoarthritis, left wrist: Secondary | ICD-10-CM | POA: Diagnosis not present

## 2023-03-08 DIAGNOSIS — R102 Pelvic and perineal pain: Secondary | ICD-10-CM | POA: Diagnosis not present

## 2023-03-08 DIAGNOSIS — M4317 Spondylolisthesis, lumbosacral region: Secondary | ICD-10-CM | POA: Diagnosis not present

## 2023-03-30 DIAGNOSIS — M25532 Pain in left wrist: Secondary | ICD-10-CM | POA: Diagnosis not present

## 2023-03-30 DIAGNOSIS — M654 Radial styloid tenosynovitis [de Quervain]: Secondary | ICD-10-CM | POA: Diagnosis not present

## 2023-04-04 ENCOUNTER — Other Ambulatory Visit: Payer: Self-pay | Admitting: Orthopedic Surgery

## 2023-04-08 ENCOUNTER — Encounter (HOSPITAL_BASED_OUTPATIENT_CLINIC_OR_DEPARTMENT_OTHER): Payer: Self-pay | Admitting: Orthopedic Surgery

## 2023-04-08 ENCOUNTER — Other Ambulatory Visit: Payer: Self-pay

## 2023-04-11 NOTE — Progress Notes (Signed)

## 2023-04-15 ENCOUNTER — Encounter (HOSPITAL_BASED_OUTPATIENT_CLINIC_OR_DEPARTMENT_OTHER): Payer: Self-pay | Admitting: Orthopedic Surgery

## 2023-04-15 ENCOUNTER — Other Ambulatory Visit: Payer: Self-pay

## 2023-04-15 ENCOUNTER — Ambulatory Visit (HOSPITAL_BASED_OUTPATIENT_CLINIC_OR_DEPARTMENT_OTHER)
Admission: RE | Admit: 2023-04-15 | Discharge: 2023-04-15 | Disposition: A | Discharge status: Home / Self Care | Payer: Medicare Other | Attending: Orthopedic Surgery | Admitting: Orthopedic Surgery

## 2023-04-15 ENCOUNTER — Ambulatory Visit (HOSPITAL_BASED_OUTPATIENT_CLINIC_OR_DEPARTMENT_OTHER): Payer: Medicare Other | Admitting: Anesthesiology

## 2023-04-15 ENCOUNTER — Encounter (HOSPITAL_BASED_OUTPATIENT_CLINIC_OR_DEPARTMENT_OTHER)
Admission: RE | Disposition: A | Discharge status: Home / Self Care | Payer: Self-pay | Source: Home / Self Care | Attending: Orthopedic Surgery

## 2023-04-15 DIAGNOSIS — I10 Essential (primary) hypertension: Secondary | ICD-10-CM | POA: Insufficient documentation

## 2023-04-15 DIAGNOSIS — M654 Radial styloid tenosynovitis [de Quervain]: Secondary | ICD-10-CM | POA: Diagnosis not present

## 2023-04-15 DIAGNOSIS — Z01818 Encounter for other preprocedural examination: Secondary | ICD-10-CM

## 2023-04-15 DIAGNOSIS — M67432 Ganglion, left wrist: Secondary | ICD-10-CM | POA: Diagnosis not present

## 2023-04-15 DIAGNOSIS — R2242 Localized swelling, mass and lump, left lower limb: Secondary | ICD-10-CM | POA: Diagnosis not present

## 2023-04-15 HISTORY — PX: GANGLION CYST EXCISION: SHX1691

## 2023-04-15 HISTORY — PX: DORSAL COMPARTMENT RELEASE: SHX5039

## 2023-04-15 SURGERY — RELEASE, FIRST DORSAL COMPARTMENT, HAND
Anesthesia: General | Site: Wrist | Laterality: Left

## 2023-04-15 MED ORDER — CEFAZOLIN SODIUM-DEXTROSE 2-4 GM/100ML-% IV SOLN
INTRAVENOUS | Status: AC
  Filled 2023-04-15: qty 100

## 2023-04-15 MED ORDER — MIDAZOLAM HCL 2 MG/2ML IJ SOLN
INTRAMUSCULAR | Status: AC
  Filled 2023-04-15: qty 2

## 2023-04-15 MED ORDER — PHENYLEPHRINE HCL (PRESSORS) 10 MG/ML IV SOLN
INTRAVENOUS | Status: DC | PRN
  Administered 2023-04-15: 160 ug via INTRAVENOUS

## 2023-04-15 MED ORDER — HYDROCODONE-ACETAMINOPHEN 5-325 MG PO TABS: 1.0000 | ORAL_TABLET | Freq: Four times a day (QID) | ORAL | 0 refills | Status: AC | PRN

## 2023-04-15 MED ORDER — PROPOFOL 10 MG/ML IV BOLUS
INTRAVENOUS | Status: DC | PRN
  Administered 2023-04-15: 200 mg via INTRAVENOUS

## 2023-04-15 MED ORDER — PROPOFOL 10 MG/ML IV BOLUS
INTRAVENOUS | Status: AC
  Filled 2023-04-15: qty 20

## 2023-04-15 MED ORDER — CEFAZOLIN SODIUM-DEXTROSE 2-3 GM-%(50ML) IV SOLR
INTRAVENOUS | Status: DC | PRN
  Administered 2023-04-15: 2 g via INTRAVENOUS

## 2023-04-15 MED ORDER — BUPIVACAINE HCL (PF) 0.25 % IJ SOLN
INTRAMUSCULAR | Status: DC | PRN
  Administered 2023-04-15: 9 mL

## 2023-04-15 MED ORDER — LACTATED RINGERS IV SOLN: INTRAVENOUS | Status: DC

## 2023-04-15 MED ORDER — 0.9 % SODIUM CHLORIDE (POUR BTL) OPTIME
TOPICAL | Status: DC | PRN
  Administered 2023-04-15: 1000 mL

## 2023-04-15 MED ORDER — MIDAZOLAM HCL 2 MG/2ML IJ SOLN
INTRAMUSCULAR | Status: DC | PRN
  Administered 2023-04-15: 2 mg via INTRAVENOUS

## 2023-04-15 MED ORDER — LIDOCAINE HCL (CARDIAC) PF 100 MG/5ML IV SOSY
PREFILLED_SYRINGE | INTRAVENOUS | Status: DC | PRN
  Administered 2023-04-15: 100 mg via INTRAVENOUS

## 2023-04-15 MED ORDER — FENTANYL CITRATE (PF) 100 MCG/2ML IJ SOLN
25.0000 ug | INTRAMUSCULAR | Status: DC | PRN
  Administered 2023-04-15: 25 ug via INTRAVENOUS

## 2023-04-15 MED ORDER — DEXAMETHASONE SODIUM PHOSPHATE 10 MG/ML IJ SOLN
INTRAMUSCULAR | Status: DC | PRN
  Administered 2023-04-15: 5 mg via INTRAVENOUS

## 2023-04-15 MED ORDER — FENTANYL CITRATE (PF) 100 MCG/2ML IJ SOLN
INTRAMUSCULAR | Status: DC | PRN
  Administered 2023-04-15: 100 ug via INTRAVENOUS

## 2023-04-15 MED ORDER — LACTATED RINGERS IV SOLN: INTRAVENOUS | Status: DC | PRN

## 2023-04-15 MED ORDER — FENTANYL CITRATE (PF) 100 MCG/2ML IJ SOLN
INTRAMUSCULAR | Status: AC
  Filled 2023-04-15: qty 2

## 2023-04-15 MED ORDER — ONDANSETRON HCL 4 MG/2ML IJ SOLN
INTRAMUSCULAR | Status: DC | PRN
  Administered 2023-04-15: 4 mg via INTRAVENOUS

## 2023-04-15 MED ORDER — CEFAZOLIN SODIUM-DEXTROSE 2-4 GM/100ML-% IV SOLN: 2.0000 g | INTRAVENOUS | Status: DC

## 2023-04-15 MED ORDER — ACETAMINOPHEN 500 MG PO TABS
1000.0000 mg | ORAL_TABLET | Freq: Once | ORAL | Status: AC
  Administered 2023-04-15: 1000 mg via ORAL

## 2023-04-15 MED ORDER — ACETAMINOPHEN 500 MG PO TABS
ORAL_TABLET | ORAL | Status: AC
  Filled 2023-04-15: qty 2

## 2023-04-15 SURGICAL SUPPLY — 41 items
BENZOIN TINCTURE PRP APPL 2/3 (GAUZE/BANDAGES/DRESSINGS) IMPLANT
BLADE MINI RND TIP GREEN BEAV (BLADE) IMPLANT
BLADE SURG 15 STRL LF DISP TIS (BLADE) ×2 IMPLANT
BLADE SURG 15 STRL SS (BLADE) ×2
BNDG ELASTIC 2INX 5YD STR LF (GAUZE/BANDAGES/DRESSINGS) IMPLANT
BNDG ELASTIC 3INX 5YD STR LF (GAUZE/BANDAGES/DRESSINGS) ×1 IMPLANT
BNDG ESMARK 4X9 LF (GAUZE/BANDAGES/DRESSINGS) IMPLANT
BNDG GAUZE DERMACEA FLUFF 4 (GAUZE/BANDAGES/DRESSINGS) ×1 IMPLANT
CHLORAPREP W/TINT 26 (MISCELLANEOUS) ×1 IMPLANT
CORD BIPOLAR FORCEPS 12FT (ELECTRODE) ×1 IMPLANT
COVER BACK TABLE 60X90IN (DRAPES) ×1 IMPLANT
COVER MAYO STAND STRL (DRAPES) ×1 IMPLANT
CUFF TOURN SGL QUICK 18X4 (TOURNIQUET CUFF) ×1 IMPLANT
DRAPE EXTREMITY T 121X128X90 (DISPOSABLE) ×1 IMPLANT
DRAPE SURG 17X23 STRL (DRAPES) ×1 IMPLANT
GAUZE PAD ABD 8X10 STRL (GAUZE/BANDAGES/DRESSINGS) ×1 IMPLANT
GAUZE SPONGE 4X4 12PLY STRL (GAUZE/BANDAGES/DRESSINGS) ×1 IMPLANT
GAUZE XEROFORM 1X8 LF (GAUZE/BANDAGES/DRESSINGS) ×1 IMPLANT
GLOVE BIO SURGEON STRL SZ7.5 (GLOVE) ×1 IMPLANT
GLOVE BIOGEL PI IND STRL 8 (GLOVE) ×1 IMPLANT
GOWN STRL REUS W/ TWL LRG LVL3 (GOWN DISPOSABLE) ×1 IMPLANT
GOWN STRL REUS W/TWL LRG LVL3 (GOWN DISPOSABLE) ×1
GOWN STRL REUS W/TWL XL LVL3 (GOWN DISPOSABLE) ×1 IMPLANT
NDL HYPO 25X1 1.5 SAFETY (NEEDLE) ×1 IMPLANT
NEEDLE HYPO 25X1 1.5 SAFETY (NEEDLE) ×1 IMPLANT
NS IRRIG 1000ML POUR BTL (IV SOLUTION) ×1 IMPLANT
PACK BASIN DAY SURGERY FS (CUSTOM PROCEDURE TRAY) ×1 IMPLANT
PAD CAST 3X4 CTTN HI CHSV (CAST SUPPLIES) IMPLANT
PADDING CAST ABS COTTON 4X4 ST (CAST SUPPLIES) ×1 IMPLANT
PADDING CAST COTTON 3X4 STRL (CAST SUPPLIES) ×1
SPLINT PLASTER CAST XFAST 3X15 (CAST SUPPLIES) IMPLANT
STOCKINETTE 4X48 STRL (DRAPES) ×1 IMPLANT
STRIP CLOSURE SKIN 1/2X4 (GAUZE/BANDAGES/DRESSINGS) IMPLANT
SUT ETHILON 4 0 PS 2 18 (SUTURE) ×1 IMPLANT
SUT MNCRL AB 4-0 PS2 18 (SUTURE) IMPLANT
SUT MON AB 5-0 PS2 18 (SUTURE) IMPLANT
SUT VIC AB 4-0 P2 18 (SUTURE) IMPLANT
SYR BULB EAR ULCER 3OZ GRN STR (SYRINGE) ×1 IMPLANT
SYR CONTROL 10ML LL (SYRINGE) ×1 IMPLANT
TOWEL GREEN STERILE FF (TOWEL DISPOSABLE) ×2 IMPLANT
UNDERPAD 30X36 HEAVY ABSORB (UNDERPADS AND DIAPERS) ×1 IMPLANT

## 2023-04-15 NOTE — Transfer of Care (Signed)
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  Last Pain:  Vitals:   04/15/23 1204  TempSrc: Temporal  PainSc: 0-No pain         Complications: No notable events documented.

## 2023-04-15 NOTE — Anesthesia Procedure Notes (Signed)
Procedure Name: LMA Insertion Date/Time: 04/15/2023 3:14 PM  Performed by: Karen Kitchens, CRNAPre-anesthesia Checklist: Patient identified, Emergency Drugs available, Suction available and Patient being monitored Patient Re-evaluated:Patient Re-evaluated prior to induction Oxygen Delivery Method: Circle system utilized Preoxygenation: Pre-oxygenation with 100% oxygen Induction Type: IV induction Ventilation: Mask ventilation without difficulty LMA: LMA inserted LMA Size: 4.0 Number of attempts: 1 Airway Equipment and Method: Bite block Placement Confirmation: positive ETCO2, CO2 detector and breath sounds checked- equal and bilateral Tube secured with: Tape Dental Injury: Teeth and Oropharynx as per pre-operative assessment

## 2023-04-15 NOTE — Op Note (Signed)
NAME: Daryl Levine MEDICAL RECORD NO: [id number] DATE OF BIRTH: [birth year] FACILITY: [facility] LOCATION: [facility] PHYSICIAN: Tami Ribas, MD   OPERATIVE REPORT   DATE OF PROCEDURE: 04/15/23    PREOPERATIVE DIAGNOSIS: Left de Quervain's tenosynovitis and tendon sheath cyst   POSTOPERATIVE DIAGNOSIS: Left de Quervain's tenosynovitis and tendon sheath cyst   PROCEDURE: 1.  Left first dorsal compartment release 2.  Excision of left wrist tendon sheath cyst   SURGEON:  Betha Loa, M.D.   ASSISTANT: none   ANESTHESIA:  General   INTRAVENOUS FLUIDS:  Per anesthesia flow sheet.   ESTIMATED BLOOD LOSS:  Minimal.   COMPLICATIONS:  None.   SPECIMENS: Left wrist cyst to pathology   TOURNIQUET TIME:    Total Tourniquet Time Documented: Upper Arm (Left) - 14 minutes Total: Upper Arm (Left) - 14 minutes    DISPOSITION:  Stable to PACU.   INDICATIONS: 72 year old male with left de Quervain's tenosynovitis and associated tendon sheath cyst.  These are bothersome to him.  He wishes to have the cyst removed in the first dorsal compartment released.  Risks, benefits and alternatives of surgery were discussed including the risks of blood loss, infection, damage to nerves, vessels, tendons, ligaments, bone for surgery, need for additional surgery, complications with wound healing, continued pain, stiffness, , recurrence.  He voiced understanding of these risks and elected to proceed.  OPERATIVE COURSE:  After being identified preoperatively by myself,  the patient and I agreed on the procedure and site of the procedure.  The surgical site was marked.  Surgical consent had been signed. Preoperative IV antibiotic prophylaxis was given. He was transferred to the operating room and placed on the operating table in supine position with the left upper extremity on an arm board.  General anesthesia was induced by the anesthesiologist.left upper extremity was prepped and draped in  normal sterile orthopedic fashion.  A surgical pause was performed between the surgeons, anesthesia, and operating room staff and all were in agreement as to the patient, procedure, and site of procedure.  Tourniquet at the proximal aspect of the extremity was inflated to 250 mmHg after exsanguination of the arm with an Esmarch bandage.  A chevron shaped incision was made at the radial side of the wrist.  This was carried in subcutaneous tissues by spreading technique.  A branch of the superficial branch of the radial nerve was identified and protected throughout the case.  The cyst was easily identified.  It was coming from the first dorsal retinacular compartment.  The cyst was carefully freed up from surrounding soft tissues.  It was filled with clear gelatinous fluid.  It was removed and sent to pathology for examination.  The extensor retinaculum was released over the APL tendon.  There was a separate compartment for the EPB tendon.  This compartment was opened as well and the septum between the APL and EPB removed.  The wound was copiously irrigated with sterile saline.  It was then closed with 4-0 nylon in a horizontal mattress fashion.  It was injected with quarter percent plain Marcaine to aid in postoperative analgesia.  It was then dressed with sterile Xeroform 4 x 4's and an ABD used as a splint.  This was wrapped with Kerlix and Ace bandage.  The tourniquet was deflated at 14 minutes.  Fingertips were pink with brisk capillary refill after deflation of tourniquet.  The operative  drapes were broken down.  The patient was awoken from anesthesia safely.  He was transferred back to the stretcher and taken to PACU in stable condition.  I will see him back in the office in 1 week for postoperative followup.  I will give him a prescription for Norco 5/325 1-2 tabs PO q6 hours prn pain, dispense # 15.   Betha Loa, MD Electronically signed, 04/15/23

## 2023-04-15 NOTE — H&P (Signed)
Daryl Levine is an 72 y.o. male.   Chief Complaint: dequervains HPI: 72 yo male with left dequervains tenosynovitis and associated cystic mass at first dorsal compartment.  He wishes to have the mass removed and release of first dorsal compartment.  Allergies: No Known Allergies  Past Medical History:  Diagnosis Date   Allergic rhinitis 06/29/2013   BPH (benign prostatic hyperplasia)    BPV (benign positional vertigo) 03/09/2018   Chronic cough c/w upper airway cough syndrome from chronic sinusitis 04/24/2012   Onset 2011  PFTs 04/2014: no obstruction - Allergy profile 02/08/2018 >  Eos 0.2 /  IgE  9 RAST neg  - Sinus CT 02/20/2018  Right maxillary sinusitis.Leftward septal deviation. > rx Augmentin 875 bid x 20 days > resolved as of 03/08/2018    DOE (dyspnea on exertion) 04/23/2014   PFTs 04/2014:  No obstruction, no restriction, DLCO inaccurate due to poor technique.  - 02/08/2018  Walked RA x 3 laps @ 185 ft each stopped due to  End of study, nl pace, no sob or desat    - Spirometry 02/08/2018  FEV1 3.0 (106%)  Ratio 80 s curvature off all rx     Elevated blood pressure 04/24/2012   Associated with 11 lb weight gain.     Family history of colon cancer 04/24/2012   He plans to schedule colonoscopy next office visit    Hyperlipidemia    Obesity 04/27/2011   They try to eat only organic food.      Past Surgical History:  Procedure Laterality Date   Arthroscopic surgery     R knee   CYSTOSCOPY     prostate procedure?   TRIGGER FINGER RELEASE     L thumb    Family History: Family History  Problem Relation Age of Onset   Cancer Father 69       colon   Hypertension Brother        died age 67    Social History:   reports that he has never smoked. He has never used smokeless tobacco. He reports that he does not drink alcohol and does not use drugs.  Medications: Medications Prior to Admission  Medication Sig Dispense Refill   meclizine (ANTIVERT) 12.5 MG tablet Take 1 tablet  (12.5 mg total) by mouth 3 (three) times daily as needed for dizziness. 30 tablet 0   ondansetron (ZOFRAN-ODT) 4 MG disintegrating tablet Take 1 tablet (4 mg total) by mouth every 8 (eight) hours as needed for nausea or vomiting. 20 tablet 0    No results found for this or any previous visit (from the past 48 hour(s)).  No results found.    Blood pressure (!) 160/89, pulse 74, temperature 98.1 F (36.7 C), temperature source Temporal, resp. rate 16, height 5\' 7"  (1.702 m), weight 86.3 kg, SpO2 100%.  General appearance: alert, cooperative, and appears stated age Head: Normocephalic, without obvious abnormality, atraumatic Neck: supple, symmetrical, trachea midline Extremities: Intact sensation and capillary refill all digits.  +epl/fpl/io.  No wounds.  Pulses: 2+ and symmetric Skin: Skin color, texture, turgor normal. No rashes or lesions Neurologic: Grossly normal Incision/Wound: none  Assessment/Plan Left dequervains tenosynovitis and associated cystic mass.  Plan release first dorsal compartment and excision of mass.  Non operative and operative treatment options have been discussed with the patient and patient wishes to proceed with operative treatment. Risks, benefits and alternatives of surgery were discussed including risks of blood loss, infection, damage to nerves/vessels/tendons/ligament/bone, failure of surgery, need  for additional surgery, complication with wound healing, stiffness, recurrence.  He voiced understanding of these risks and elected to proceed.    Betha Loa 04/15/2023, 12:51 PM

## 2023-04-15 NOTE — Discharge Instructions (Addendum)
No tylenol until 6:15 p.m.  [facility] Instructions Hand Surgery  Wound Care: Keep your hand elevated above the level of your heart.  Do not allow it to dangle by your side.  Keep the dressing dry and do not remove it unless your doctor advises you to do so.  He will usually change it at the time of your post-op visit.  Moving your fingers is advised to stimulate circulation but will depend on the site of your surgery.  If you have a splint applied, your doctor will advise you regarding movement.  Activity: Do not drive or operate machinery today.  Rest today and then you may return to your normal activity and work as indicated by your physician.  Diet:  Drink liquids today or eat a light diet.  You may resume a regular diet tomorrow.    General expectations: Pain for two to three days. Fingers may become slightly swollen.  Call your doctor if any of the following occur: Severe pain not relieved by pain medication. Elevated temperature. Dressing soaked with blood. Inability to move fingers. White or bluish color to fingers.    Post Anesthesia Home Care Instructions  Activity: Get plenty of rest for the remainder of the day. A responsible individual must stay with you for 24 hours following the procedure.  For the next 24 hours, DO NOT: -Drive a car -Advertising copywriter -Drink alcoholic beverages -Take any medication unless instructed by your physician -Make any legal decisions or sign important papers.  Meals: Start with liquid foods such as gelatin or soup. Progress to regular foods as tolerated. Avoid greasy, spicy, heavy foods. If nausea and/or vomiting occur, drink only clear liquids until the nausea and/or vomiting subsides. Call your physician if vomiting continues.  Special Instructions/Symptoms: Your throat may feel dry or sore from the anesthesia or the breathing tube placed in your throat during surgery. If this causes discomfort, gargle with warm salt water. The  discomfort should disappear within 24 hours.  If you had a scopolamine patch placed behind your ear for the management of post- operative nausea and/or vomiting:  1. The medication in the patch is effective for 72 hours, after which it should be removed.  Wrap patch in a tissue and discard in the trash. Wash hands thoroughly with soap and water. 2. You may remove the patch earlier than 72 hours if you experience unpleasant side effects which may include dry mouth, dizziness or visual disturbances. 3. Avoid touching the patch. Wash your hands with soap and water after contact with the patch.

## 2023-04-15 NOTE — Anesthesia Postprocedure Evaluation (Signed)
Anesthesia Post Note  Patient: Daryl Levine  Procedure(s) Performed: release left first dorsal compartment (Left: Wrist) excision of large refinacular cyst (Left: Wrist)     Patient location during evaluation: PACU Anesthesia Type: General Level of consciousness: awake and alert Pain management: pain level controlled Vital Signs Assessment: post-procedure vital signs reviewed and stable Respiratory status: spontaneous breathing, nonlabored ventilation, respiratory function stable and patient connected to nasal cannula oxygen Cardiovascular status: blood pressure returned to baseline and stable Postop Assessment: no apparent nausea or vomiting Anesthetic complications: no  No notable events documented.  Last Vitals:  Vitals:   04/15/23 1600 04/15/23 1630  BP: 120/73 135/86  Pulse: 65 (!) 58  Resp: 12 18  Temp:  36.6 C  SpO2: 94% 99%    Last Pain:  Vitals:   04/15/23 1630  TempSrc: Temporal  PainSc: 3                  Klare Criss L Akane Tessier

## 2023-04-15 NOTE — Anesthesia Preprocedure Evaluation (Addendum)
Anesthesia Evaluation  Patient identified by MRN, date of birth, ID band Patient awake    Reviewed: Allergy & Precautions, NPO status , Patient's Chart, lab work & pertinent test results  Airway Mallampati: II  TM Distance: >3 FB Neck ROM: Full    Dental no notable dental hx. (+) Teeth Intact, Dental Advisory Given   Pulmonary neg pulmonary ROS   Pulmonary exam normal breath sounds clear to auscultation       Cardiovascular hypertension, + DOE  Normal cardiovascular exam Rhythm:Regular Rate:Normal     Neuro/Psych negative neurological ROS  negative psych ROS   GI/Hepatic negative GI ROS, Neg liver ROS,,,  Endo/Other  negative endocrine ROS    Renal/GU negative Renal ROS  negative genitourinary   Musculoskeletal negative musculoskeletal ROS (+)    Abdominal   Peds  Hematology negative hematology ROS (+)   Anesthesia Other Findings   Reproductive/Obstetrics                             Anesthesia Physical Anesthesia Plan  ASA: 2  Anesthesia Plan:    Post-op Pain Management: Tylenol PO (pre-op)*   Induction:   PONV Risk Score and Plan: 1 and Ondansetron, Dexamethasone and Treatment may vary due to age or medical condition  Airway Management Planned:   Additional Equipment:   Intra-op Plan:   Post-operative Plan:   Informed Consent: I have reviewed the patients History and Physical, chart, labs and discussed the procedure including the risks, benefits and alternatives for the proposed anesthesia with the patient or authorized representative who has indicated his/her understanding and acceptance.     Dental advisory given  Plan Discussed with: CRNA and Anesthesiologist  Anesthesia Plan Comments:        Anesthesia Quick Evaluation

## 2023-04-18 ENCOUNTER — Encounter (HOSPITAL_BASED_OUTPATIENT_CLINIC_OR_DEPARTMENT_OTHER): Payer: Self-pay | Admitting: Orthopedic Surgery

## 2023-04-19 DIAGNOSIS — M654 Radial styloid tenosynovitis [de Quervain]: Secondary | ICD-10-CM | POA: Diagnosis not present

## 2023-04-19 LAB — SURGICAL PATHOLOGY

## 2023-04-22 DIAGNOSIS — M654 Radial styloid tenosynovitis [de Quervain]: Secondary | ICD-10-CM | POA: Diagnosis not present

## 2023-05-03 DIAGNOSIS — M25532 Pain in left wrist: Secondary | ICD-10-CM | POA: Diagnosis not present

## 2023-05-03 DIAGNOSIS — M654 Radial styloid tenosynovitis [de Quervain]: Secondary | ICD-10-CM | POA: Diagnosis not present

## 2023-05-12 DIAGNOSIS — R112 Nausea with vomiting, unspecified: Secondary | ICD-10-CM | POA: Diagnosis not present

## 2023-05-12 DIAGNOSIS — R42 Dizziness and giddiness: Secondary | ICD-10-CM | POA: Diagnosis not present

## 2023-05-12 DIAGNOSIS — I10 Essential (primary) hypertension: Secondary | ICD-10-CM | POA: Diagnosis not present

## 2023-05-13 DIAGNOSIS — M2602 Maxillary hypoplasia: Secondary | ICD-10-CM | POA: Diagnosis not present

## 2023-05-13 DIAGNOSIS — R112 Nausea with vomiting, unspecified: Secondary | ICD-10-CM | POA: Diagnosis not present

## 2023-05-13 DIAGNOSIS — R42 Dizziness and giddiness: Secondary | ICD-10-CM | POA: Diagnosis not present
